# Patient Record
Sex: Male | Born: 1945 | Race: White | Hispanic: No | Marital: Married | Smoking: Never smoker
Health system: Southern US, Community
[De-identification: ages and names within clinical notes are randomized; demographics above are authoritative.]

## PROBLEM LIST (undated history)

## (undated) DIAGNOSIS — C801 Malignant (primary) neoplasm, unspecified: Secondary | ICD-10-CM

## (undated) DIAGNOSIS — K219 Gastro-esophageal reflux disease without esophagitis: Secondary | ICD-10-CM

## (undated) HISTORY — PX: GASTRIC RESECTION: SHX5248

---

## 2020-04-22 ENCOUNTER — Emergency Department
Admission: EM | Admit: 2020-04-22 | Discharge: 2020-04-22 | Disposition: A | Discharge status: Home / Self Care | Payer: Medicare Other | Attending: Emergency Medicine | Admitting: Emergency Medicine

## 2020-04-22 ENCOUNTER — Other Ambulatory Visit: Payer: Self-pay

## 2020-04-22 ENCOUNTER — Emergency Department: Payer: Medicare Other

## 2020-04-22 DIAGNOSIS — M545 Low back pain: Secondary | ICD-10-CM | POA: Insufficient documentation

## 2020-04-22 DIAGNOSIS — Z79899 Other long term (current) drug therapy: Secondary | ICD-10-CM | POA: Insufficient documentation

## 2020-04-22 DIAGNOSIS — Y999 Unspecified external cause status: Secondary | ICD-10-CM | POA: Insufficient documentation

## 2020-04-22 DIAGNOSIS — Y92410 Unspecified street and highway as the place of occurrence of the external cause: Secondary | ICD-10-CM | POA: Insufficient documentation

## 2020-04-22 DIAGNOSIS — Z7982 Long term (current) use of aspirin: Secondary | ICD-10-CM | POA: Insufficient documentation

## 2020-04-22 DIAGNOSIS — Y939 Activity, unspecified: Secondary | ICD-10-CM | POA: Insufficient documentation

## 2020-04-22 DIAGNOSIS — I6782 Cerebral ischemia: Secondary | ICD-10-CM | POA: Insufficient documentation

## 2020-04-22 MED ORDER — IBUPROFEN 400 MG PO TABS: 400.0000 mg | ORAL_TABLET | Freq: Four times a day (QID) | ORAL | 0 refills | Status: AC | PRN

## 2020-04-22 MED ORDER — LIDOCAINE 5 % EX PTCH: 1.0000 | MEDICATED_PATCH | CUTANEOUS | 0 refills | Status: AC
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
## 2020-09-07 DIAGNOSIS — Z95818 Presence of other cardiac implants and grafts: Secondary | ICD-10-CM

## 2020-09-07 HISTORY — DX: Presence of other cardiac implants and grafts: Z95.818
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
## 2022-12-16 ENCOUNTER — Encounter: Payer: Self-pay | Admitting: Internal Medicine

## 2022-12-22 ENCOUNTER — Encounter: Payer: Self-pay | Admitting: Internal Medicine

## 2022-12-23 ENCOUNTER — Ambulatory Visit
Admission: RE | Admit: 2022-12-23 | Discharge: 2022-12-23 | Disposition: A | Discharge status: Home / Self Care | Payer: Medicare Other | Attending: Internal Medicine | Admitting: Internal Medicine

## 2022-12-23 ENCOUNTER — Encounter
Admission: RE | Disposition: A | Discharge status: Home / Self Care | Payer: Self-pay | Source: Home / Self Care | Attending: Internal Medicine

## 2022-12-23 ENCOUNTER — Ambulatory Visit: Payer: Medicare Other | Admitting: Certified Registered"

## 2022-12-23 ENCOUNTER — Encounter: Payer: Self-pay | Admitting: Internal Medicine

## 2022-12-23 DIAGNOSIS — Z87891 Personal history of nicotine dependence: Secondary | ICD-10-CM | POA: Insufficient documentation

## 2022-12-23 DIAGNOSIS — Z85028 Personal history of other malignant neoplasm of stomach: Secondary | ICD-10-CM | POA: Diagnosis not present

## 2022-12-23 DIAGNOSIS — Z8673 Personal history of transient ischemic attack (TIA), and cerebral infarction without residual deficits: Secondary | ICD-10-CM | POA: Diagnosis not present

## 2022-12-23 DIAGNOSIS — I498 Other specified cardiac arrhythmias: Secondary | ICD-10-CM | POA: Diagnosis not present

## 2022-12-23 DIAGNOSIS — R0609 Other forms of dyspnea: Secondary | ICD-10-CM | POA: Diagnosis not present

## 2022-12-23 DIAGNOSIS — D122 Benign neoplasm of ascending colon: Secondary | ICD-10-CM | POA: Diagnosis not present

## 2022-12-23 DIAGNOSIS — K449 Diaphragmatic hernia without obstruction or gangrene: Secondary | ICD-10-CM | POA: Insufficient documentation

## 2022-12-23 DIAGNOSIS — B3781 Candidal esophagitis: Secondary | ICD-10-CM | POA: Diagnosis not present

## 2022-12-23 DIAGNOSIS — Z903 Acquired absence of stomach [part of]: Secondary | ICD-10-CM | POA: Insufficient documentation

## 2022-12-23 DIAGNOSIS — K2970 Gastritis, unspecified, without bleeding: Secondary | ICD-10-CM | POA: Insufficient documentation

## 2022-12-23 DIAGNOSIS — D123 Benign neoplasm of transverse colon: Secondary | ICD-10-CM | POA: Diagnosis not present

## 2022-12-23 DIAGNOSIS — Z1211 Encounter for screening for malignant neoplasm of colon: Secondary | ICD-10-CM | POA: Insufficient documentation

## 2022-12-23 DIAGNOSIS — K2100 Gastro-esophageal reflux disease with esophagitis, without bleeding: Secondary | ICD-10-CM | POA: Diagnosis not present

## 2022-12-23 DIAGNOSIS — Z86718 Personal history of other venous thrombosis and embolism: Secondary | ICD-10-CM | POA: Insufficient documentation

## 2022-12-23 DIAGNOSIS — K5730 Diverticulosis of large intestine without perforation or abscess without bleeding: Secondary | ICD-10-CM | POA: Insufficient documentation

## 2022-12-23 DIAGNOSIS — Z86010 Personal history of colonic polyps: Secondary | ICD-10-CM | POA: Diagnosis not present

## 2022-12-23 DIAGNOSIS — Z9221 Personal history of antineoplastic chemotherapy: Secondary | ICD-10-CM | POA: Diagnosis not present

## 2022-12-23 DIAGNOSIS — K640 First degree hemorrhoids: Secondary | ICD-10-CM | POA: Diagnosis not present

## 2022-12-23 DIAGNOSIS — D124 Benign neoplasm of descending colon: Secondary | ICD-10-CM | POA: Insufficient documentation

## 2022-12-23 DIAGNOSIS — D120 Benign neoplasm of cecum: Secondary | ICD-10-CM | POA: Diagnosis not present

## 2022-12-23 HISTORY — PX: ESOPHAGOGASTRODUODENOSCOPY (EGD) WITH PROPOFOL: SHX5813

## 2022-12-23 HISTORY — DX: Gastro-esophageal reflux disease without esophagitis: K21.9

## 2022-12-23 HISTORY — PX: COLONOSCOPY WITH PROPOFOL: SHX5780

## 2022-12-23 HISTORY — DX: Malignant (primary) neoplasm, unspecified: C80.1

## 2022-12-23 SURGERY — COLONOSCOPY WITH PROPOFOL
Anesthesia: General

## 2022-12-23 MED ORDER — LIDOCAINE HCL (CARDIAC) PF 100 MG/5ML IV SOSY
PREFILLED_SYRINGE | INTRAVENOUS | Status: DC | PRN
  Administered 2022-12-23: 100 mg via INTRAVENOUS

## 2022-12-23 MED ORDER — SPOT INK MARKER SYRINGE KIT
PACK | SUBMUCOSAL | Status: DC | PRN
  Administered 2022-12-23: 1 mL via SUBMUCOSAL

## 2022-12-23 MED ORDER — PROPOFOL 500 MG/50ML IV EMUL
INTRAVENOUS | Status: DC | PRN
  Administered 2022-12-23: 50 mg via INTRAVENOUS
  Administered 2022-12-23: 200 ug/kg/min via INTRAVENOUS

## 2022-12-23 MED ORDER — SODIUM CHLORIDE 0.9 % IV SOLN
INTRAVENOUS | Status: AC | PRN
  Administered 2022-12-23: .5 mL via INTRAMUSCULAR

## 2022-12-23 MED ORDER — SODIUM CHLORIDE 0.9 % IV SOLN: INTRAVENOUS | Status: DC

## 2022-12-23 NOTE — Anesthesia Postprocedure Evaluation (Signed)
Anesthesia Post Note  Patient: Jeffrey Robles  Procedure(s) Performed: COLONOSCOPY WITH PROPOFOL ESOPHAGOGASTRODUODENOSCOPY (EGD) WITH PROPOFOL  Patient location during evaluation: PACU Anesthesia Type: General Level of consciousness: oriented and awake and alert Pain management: pain level controlled Vital Signs Assessment: post-procedure vital signs reviewed and stable Respiratory status: spontaneous breathing and nonlabored ventilation Cardiovascular status: blood pressure returned to baseline Anesthetic complications: no   There were no known notable events for this encounter.   Last Vitals:  Vitals:   12/23/22 0900 12/23/22 1027  BP: (!) 138/93 123/84  Pulse: 63 70  Resp: 17 15  Temp: 36.8 C (!) 35.8 C  SpO2: 97% 95%    Last Pain:  Vitals:   12/23/22 1027  TempSrc: Temporal  PainSc: Asleep                 VAN STAVEREN,Virdell Hoiland

## 2022-12-23 NOTE — Anesthesia Preprocedure Evaluation (Signed)
Anesthesia Evaluation  Patient identified by MRN, date of birth, ID band Patient awake    Reviewed: Allergy & Precautions, NPO status , Patient's Chart, lab work & pertinent test results  Airway Mallampati: II  TM Distance: >3 FB Neck ROM: Full    Dental  (+) Upper Dentures, Lower Dentures   Pulmonary neg pulmonary ROS, Patient abstained from smoking., former smoker   Pulmonary exam normal  + decreased breath sounds      Cardiovascular Exercise Tolerance: Poor + DOE  negative cardio ROS Normal cardiovascular exam Rhythm:Regular Rate:Normal  Brugada Syndrome   Neuro/Psych negative neurological ROS  negative psych ROS   GI/Hepatic negative GI ROS, Neg liver ROS,GERD  Medicated,,Hx of stomach cancer   Endo/Other  negative endocrine ROS    Renal/GU negative Renal ROS  negative genitourinary   Musculoskeletal   Abdominal Normal abdominal exam  (+)   Peds negative pediatric ROS (+)  Hematology negative hematology ROS (+)   Anesthesia Other Findings Past Medical History: No date: Cancer No date: GERD (gastroesophageal reflux disease) 2022: Presence of implantable pulmonary artery pressure and heart  rate monitoring system  Past Surgical History: No date: GASTRIC RESECTION  BMI    Body Mass Index: 26.72 kg/m      Reproductive/Obstetrics negative OB ROS                             Anesthesia Physical Anesthesia Plan  ASA: 3  Anesthesia Plan: General   Post-op Pain Management:    Induction: Intravenous  PONV Risk Score and Plan: Propofol infusion and TIVA  Airway Management Planned: Natural Airway  Additional Equipment:   Intra-op Plan:   Post-operative Plan:   Informed Consent: I have reviewed the patients History and Physical, chart, labs and discussed the procedure including the risks, benefits and alternatives for the proposed anesthesia with the patient or authorized  representative who has indicated his/her understanding and acceptance.     Dental Advisory Given  Plan Discussed with: CRNA and Surgeon  Anesthesia Plan Comments:        Anesthesia Quick Evaluation

## 2022-12-23 NOTE — Op Note (Signed)
[facility] Gastroenterology Patient Name: Jeffrey Robles Procedure Date: 12/23/2022 9:29 AM MRN: [id number] Account #: [id number] Date of Birth: 20-Apr-1946 Admit Type: Outpatient Age: 77 Room: [facility] ENDO ROOM 2 Gender: Male Note Status: Finalized Instrument Name: Upper Endoscope [id number] Procedure:             Upper GI endoscopy Indications:           Gastro-esophageal reflux disease, Failure to respond                         to medical treatment Providers:             Boykin Nearing. Timira Bieda MD, MD Referring MD:          No PCP Medicines:             Propofol per Anesthesia Complications:         No immediate complications. Estimated blood loss: None. Procedure:             Pre-Anesthesia Assessment:                        - The risks and benefits of the procedure and the                         sedation options and risks were discussed with the                         patient. All questions were answered and informed                         consent was obtained.                        - Patient identification and proposed procedure were                         verified prior to the procedure by the nurse. The                         procedure was verified in the procedure room.                        - ASA Grade Assessment: III - A patient with severe                         systemic disease.                        - After reviewing the risks and benefits, the patient                         was deemed in satisfactory condition to undergo the                         procedure.                        After obtaining informed consent, the endoscope was  passed under direct vision. Throughout the procedure,                         the patient's blood pressure, pulse, and oxygen                         saturations were monitored continuously. The Endoscope                         was introduced through the mouth, and advanced to the                          third part of duodenum. The upper GI endoscopy was                         accomplished without difficulty. The patient tolerated                         the procedure well. Findings:      Mucosal changes including feline appearance, longitudinal furrows and       white plaques were found in the entire esophagus. Esophageal findings       were graded using the Eosinophilic Esophagitis Endoscopic Reference       Score (EoE-EREFS) as: Edema Grade 1 Present (decreased clarity or       absence of vascular markings), Rings Grade 1 Mild (subtle       circumferential ridges seen on esophageal distension), Exudates Grade 2       Severe (scattered white lesions involving 10 percent or greater of the       esophageal surface area), Furrows Grade 1 Mild (vertical lines without       visible depth) and Stricture none (no stricture found). Biopsies were       obtained from the proximal and distal esophagus with cold forceps for       histology of suspected eosinophilic esophagitis.      Scattered mild inflammation characterized by congestion (edema) and       erythema was found in the entire examined stomach.      A 2 cm hiatal hernia was present.      The examined duodenum was normal.      The exam was otherwise without abnormality. 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                        - Gastritis.                        - 2 cm hiatal hernia.                        - Normal examined duodenum.                        - The examination was otherwise normal.                        - Biopsies were taken with  a cold forceps for                         evaluation of eosinophilic esophagitis. Recommendation:        - Continue present medications.                        - Await pathology results.                        - Proceed with colonoscopy Procedure Code(s):     --- Professional ---                         [phone], Esophagogastroduodenoscopy, flexible,                         transoral; with biopsy, single or multiple Diagnosis Code(s):     --- Professional ---                        K21.9, Gastro-esophageal reflux disease without                         esophagitis                        K44.9, Diaphragmatic hernia without obstruction or                         gangrene                        K29.70, Gastritis, unspecified, without bleeding                        K22.89, Other specified disease of esophagus CPT copyright 2022 American Medical Association. All rights reserved. The codes documented in this report are preliminary and upon coder review may  be revised to meet current compliance requirements. Stanton Kidney MD, MD 12/23/2022 9:54:59 AM This report has been signed electronically. Number of Addenda: 0 Note Initiated On: 12/23/2022 9:29 AM Estimated Blood Loss:  Estimated blood loss: none.      [facility]

## 2022-12-23 NOTE — H&P (Signed)
Outpatient short stay form Pre-procedure 12/23/2022 7:53 AM Jeffrey Robles K. Norma Fredrickson, M.D.  Primary Physician: Dr. Georjean Mode [facility])  Reason for visit:  GERD, esophagitis, personal history of colon polyps  History of present illness:  Patient request screening colonoscopy.Patient denies abdominal pain, change in bowel habits, rectal bleeding or involuntary weight loss. Patient underwent an attempted colonoscopy evaluation in August 2006 by Dr. Zettie Cooley and it [facility]. Incomplete evaluation to the transverse colon was documented with the only findings of sigmoid diverticulosis noted. Patient has a remote history of adenomatous colon polyps. Follow-up CT colonography in August 2006 revealed under distention of the left colon with incomplete evaluation. There was a small "pedunculated nodule" noted in the ascending colon questionable for fecal matter versus tiny polyp. The patient presents today also mentioning colonoscopy in [place] (circa 2016) revealing no polyps, colonoscopy January 2023 in [place] revealing "9 polyps". Patient also underwent an EGD in January 2023 revealing "polyps in the esophagus". Patient is a gastric cancer survivor and underwent partial gastrectomy with postop chemotherapy in 1994. From an upper GI standpoint, patient has a history of GERD which can be "terrible" without medication. He takes to 20 mg esomeprazole tablets in the morning and has fewer than 2 breakthrough events of reflux a month. Patient denies any known history of Barrett's esophagus, dysphagia, involuntary weight loss. He reports a good appetite. He denies hematemesis or melena. 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 He moved several times over this time period. 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 He denies taking any current anticoagulants. Other than mild tremor, no other potential sequelae of  stroke are noted.    No current facility-administered medications for this encounter.  Medications Prior to Admission  Medication Sig Dispense Refill Last Dose   omeprazole (PRILOSEC OTC) 20 MG tablet Take 40 mg by mouth daily.      aspirin 325 MG tablet Take 325 mg by mouth daily.      atorvastatin (LIPITOR) 20 MG tablet Take 20 mg by mouth daily.      DULoxetine (CYMBALTA) 60 MG capsule Take 60 mg by mouth daily.      ibuprofen (ADVIL) 400 MG tablet Take 1 tablet (400 mg total) by mouth every 6 (six) hours as needed. 30 tablet 0    lidocaine (LIDODERM) 5 % Place 1 patch onto the skin daily. Remove & Discard patch within 12 hours or as directed by MD 30 patch 0    metoprolol tartrate (LOPRESSOR) 50 MG tablet Take 50 mg by mouth 2 (two) times daily.        Not on File   Past Medical History:  Diagnosis Date   Cancer    GERD (gastroesophageal reflux disease)    Presence of implantable pulmonary artery pressure and heart rate monitoring system 2022    Review of systems:  Otherwise negative.    Physical Exam  Gen: Alert, oriented. Appears stated age.  HEENT: /AT. PERRLA. Lungs: CTA, no wheezes. CV: RR nl S1, S2. Abd: soft, benign, no masses. BS+ Ext: No edema. Pulses 2+    Planned procedures: Proceed with EGD and  colonoscopy. The patient understands the nature of the planned procedure, indications, risks, alternatives and potential complications including but not limited to bleeding, infection, perforation, damage to internal organs and possible oversedation/side effects from anesthesia. The patient agrees and gives consent to proceed.  Please refer to  procedure notes for findings, recommendations and patient disposition/instructions.     Laurent Cargile K. Norma Fredrickson, M.D. Gastroenterology 12/23/2022  7:53 AM

## 2022-12-23 NOTE — Transfer of Care (Signed)
Immediate Anesthesia Transfer of Care Note  Patient: Jeffrey Robles  Procedure(s) Performed: COLONOSCOPY WITH PROPOFOL ESOPHAGOGASTRODUODENOSCOPY (EGD) WITH PROPOFOL  Patient Location: PACU  Anesthesia Type:General  Level of Consciousness: drowsy  Airway & Oxygen Therapy: Patient Spontanous Breathing and Patient connected to nasal cannula oxygen  Post-op Assessment: Report given to RN and Post -op Vital signs reviewed and stable  Post vital signs: stable  Last Vitals:  Vitals Value Taken Time  BP 123/84 12/23/22 1027  Temp 35.8 C 12/23/22 1027  Pulse 70 12/23/22 1027  Resp 15 12/23/22 1027  SpO2 95 % 12/23/22 1027    Last Pain:  Vitals:   12/23/22 1027  TempSrc: Temporal  PainSc: Asleep         Complications: No notable events documented.

## 2022-12-23 NOTE — Op Note (Signed)
[facility] Gastroenterology Patient Name: Jeffrey Robles Procedure Date: 12/23/2022 9:28 AM MRN: [id number] Account #: [id number] Date of Birth: [birth year] Admit Type: Outpatient Age: 77 Room: [facility] [address] ENDO ROOM 2 Gender: Male Note Status: Finalized Instrument Name: Prentice Docker [id number] Procedure:             Colonoscopy Indications:           High risk colon cancer surveillance: Personal history                         of multiple (3 or more) adenomas Providers:             Boykin Nearing. Trinitie Mcgirr MD, MD Referring MD:          no PCP Medicines:             Propofol per Anesthesia Complications:         No immediate complications. Estimated blood loss: None. Procedure:             Pre-Anesthesia Assessment:                        - The risks and benefits of the procedure and the                         sedation options and risks were discussed with the                         patient. All questions were answered and informed                         consent was obtained.                        - Patient identification and proposed procedure were                         verified prior to the procedure by the nurse. The                         procedure was verified in the procedure room.                        - ASA Grade Assessment: III - A patient with severe                         systemic disease.                        - After reviewing the risks and benefits, the patient                         was deemed in satisfactory condition to undergo the                         procedure.                        After obtaining informed consent, the colonoscope was  passed under direct vision. Throughout the procedure,                         the patient's blood pressure, pulse, and oxygen                         saturations were monitored continuously. The                         Colonoscope was introduced through the anus and                          advanced to the the cecum, identified by appendiceal                         orifice and ileocecal valve. The colonoscopy was                         performed without difficulty. The patient tolerated                         the procedure well. The quality of the bowel                         preparation was adequate. The ileocecal valve,                         appendiceal orifice, and rectum were photographed. Findings:      The perianal and digital rectal examinations were normal. Pertinent       negatives include normal sphincter tone and no palpable rectal lesions.      Non-bleeding internal hemorrhoids were found during retroflexion. The       hemorrhoids were Grade I (internal hemorrhoids that do not prolapse).      Many medium-mouthed and small-mouthed diverticula were found in the       transverse colon and left colon.      A 4 mm polyp was found in the ileocecal valve. The polyp was sessile.       The polyp was removed with a jumbo cold forceps. Resection and retrieval       were complete.      Two sessile polyps were found in the transverse colon. The polyps were 3       to 5 mm in size. These polyps were removed with a jumbo cold forceps.       Resection and retrieval were complete.      A 4 mm polyp was found in the ascending colon. The polyp was sessile.       The polyp was removed with a jumbo cold forceps. Resection and retrieval       were complete.      A 20 mm polyp was found in the descending colon. The polyp was       carpet-like. Polypectomy was attempted, initially using a saline       injection-lift technique with a cold snare. Polyp resection was       incomplete with this device. This intervention then required a different       device and polypectomy technique. The polyp was removed with a piecemeal       technique using a hot snare. Resection  and retrieval were complete. To       prevent bleeding after the polypectomy, two hemostatic clips were        successfully placed (MR conditional). Clip manufacturer: [employer]. There was no bleeding at the end of the procedure. Area was       successfully injected with 1 mL Spot (carbon black) for tattooing.      The exam was otherwise without abnormality. Impression:            - Non-bleeding internal hemorrhoids.                        - Diverticulosis in the transverse colon and in the                         left colon.                        - One 4 mm polyp at the ileocecal valve, removed with                         a jumbo cold forceps. Resected and retrieved.                        - Two 3 to 5 mm polyps in the transverse colon,                         removed with a jumbo cold forceps. Resected and                         retrieved.                        - One 4 mm polyp in the ascending colon, removed with                         a jumbo cold forceps. Resected and retrieved.                        - One 20 mm polyp in the descending colon, removed                         piecemeal using a hot snare. Resected and retrieved.                         Clips (MR conditional) were placed. Clip manufacturer:                         [employer]. Injected.                        - The examination was otherwise normal. Recommendation:        - Patient has a contact number available for                         emergencies. The signs and symptoms of potential                         delayed complications  were discussed with the patient.                         Return to normal activities tomorrow. Written                         discharge instructions were provided to the patient.                        - Await pathology results from EGD, also performed                         today.                        - Resume previous diet.                        - Continue present medications.                        - Repeat colonoscopy is recommended for surveillance.                          The colonoscopy date will be determined after                         pathology results from today's exam become available                         for review.                        - Return to my office in 3 months.                        - Telephone GI office to schedule appointment.                        - The findings and recommendations were discussed with                         the patient. Procedure Code(s):     --- Professional ---                        [phone], Colonoscopy, flexible; with removal of                         tumor(s), polyp(s), or other lesion(s) by snare                         technique                        45380, 59, Colonoscopy, flexible; with biopsy, single                         or multiple                        45381, Colonoscopy, flexible; with directed submucosal  injection(s), any substance Diagnosis Code(s):     --- Professional ---                        K57.30, Diverticulosis of large intestine without                         perforation or abscess without bleeding                        D12.3, Benign neoplasm of transverse colon (hepatic                         flexure or splenic flexure)                        D12.4, Benign neoplasm of descending colon                        D12.0, Benign neoplasm of cecum                        D12.2, Benign neoplasm of ascending colon                        K64.0, First degree hemorrhoids                        Z86.010, Personal history of colonic polyps CPT copyright 2022 American Medical Association. All rights reserved. The codes documented in this report are preliminary and upon coder review may  be revised to meet current compliance requirements. Stanton Kidney MD, MD 12/23/2022 10:33:51 AM This report has been signed electronically. Number of Addenda: 0 Note Initiated On: 12/23/2022 9:28 AM Scope Withdrawal Time: 0 hours 22 minutes 27 seconds  Total Procedure Duration:  0 hours 29 minutes 37 seconds  Estimated Blood Loss:  Estimated blood loss: none. Estimated blood loss:                         none. Estimated blood loss: none.      [facility]

## 2022-12-24 ENCOUNTER — Encounter: Payer: Self-pay | Admitting: Internal Medicine

## 2022-12-24 LAB — SURGICAL PATHOLOGY

## 2023-01-13 ENCOUNTER — Encounter: Payer: Self-pay | Admitting: Internal Medicine

## 2023-08-19 ENCOUNTER — Other Ambulatory Visit: Payer: Self-pay | Admitting: Cardiovascular Disease

## 2023-08-19 DIAGNOSIS — Z0000 Encounter for general adult medical examination without abnormal findings: Secondary | ICD-10-CM

## 2023-09-06 ENCOUNTER — Ambulatory Visit: Payer: PRIVATE HEALTH INSURANCE

## 2023-09-06 DIAGNOSIS — Z0000 Encounter for general adult medical examination without abnormal findings: Secondary | ICD-10-CM

## 2023-12-25 ENCOUNTER — Other Ambulatory Visit: Payer: Self-pay

## 2023-12-25 ENCOUNTER — Emergency Department
Admission: EM | Admit: 2023-12-25 | Discharge: 2023-12-28 | Disposition: A | Discharge status: Home / Self Care | Payer: No Typology Code available for payment source | Attending: Emergency Medicine | Admitting: Emergency Medicine

## 2023-12-25 ENCOUNTER — Emergency Department: Payer: No Typology Code available for payment source

## 2023-12-25 DIAGNOSIS — R451 Restlessness and agitation: Secondary | ICD-10-CM | POA: Insufficient documentation

## 2023-12-25 DIAGNOSIS — F03911 Unspecified dementia, unspecified severity, with agitation: Secondary | ICD-10-CM | POA: Diagnosis present

## 2023-12-25 DIAGNOSIS — Z7189 Other specified counseling: Secondary | ICD-10-CM | POA: Diagnosis not present

## 2023-12-25 DIAGNOSIS — I10 Essential (primary) hypertension: Secondary | ICD-10-CM | POA: Diagnosis not present

## 2023-12-25 DIAGNOSIS — Z8673 Personal history of transient ischemic attack (TIA), and cerebral infarction without residual deficits: Secondary | ICD-10-CM | POA: Insufficient documentation

## 2023-12-25 DIAGNOSIS — F329 Major depressive disorder, single episode, unspecified: Secondary | ICD-10-CM | POA: Insufficient documentation

## 2023-12-25 DIAGNOSIS — R1311 Dysphagia, oral phase: Secondary | ICD-10-CM | POA: Diagnosis not present

## 2023-12-25 DIAGNOSIS — F03918 Unspecified dementia, unspecified severity, with other behavioral disturbance: Secondary | ICD-10-CM | POA: Diagnosis not present

## 2023-12-25 DIAGNOSIS — F0390 Unspecified dementia without behavioral disturbance: Secondary | ICD-10-CM | POA: Diagnosis not present

## 2023-12-25 DIAGNOSIS — F4310 Post-traumatic stress disorder, unspecified: Secondary | ICD-10-CM | POA: Diagnosis not present

## 2023-12-25 DIAGNOSIS — Z515 Encounter for palliative care: Secondary | ICD-10-CM | POA: Insufficient documentation

## 2023-12-25 DIAGNOSIS — F411 Generalized anxiety disorder: Secondary | ICD-10-CM | POA: Insufficient documentation

## 2023-12-25 DIAGNOSIS — Z66 Do not resuscitate: Secondary | ICD-10-CM

## 2023-12-25 LAB — CBC WITH DIFFERENTIAL/PLATELET
Abs Immature Granulocytes: 0.01 10*3/uL (ref 0.00–0.07)
Basophils Absolute: 0.1 10*3/uL (ref 0.0–0.1)
Basophils Relative: 2 %
Eosinophils Absolute: 0.1 10*3/uL (ref 0.0–0.5)
Eosinophils Relative: 3 %
HCT: 29.5 % — ABNORMAL LOW (ref 39.0–52.0)
Hemoglobin: 10.1 g/dL — ABNORMAL LOW (ref 13.0–17.0)
Immature Granulocytes: 0 %
Lymphocytes Relative: 22 %
Lymphs Abs: 1.2 10*3/uL (ref 0.7–4.0)
MCH: 35.3 pg — ABNORMAL HIGH (ref 26.0–34.0)
MCHC: 34.2 g/dL (ref 30.0–36.0)
MCV: 103.1 fL — ABNORMAL HIGH (ref 80.0–100.0)
Monocytes Absolute: 0.7 10*3/uL (ref 0.1–1.0)
Monocytes Relative: 13 %
Neutro Abs: 3.2 10*3/uL (ref 1.7–7.7)
Neutrophils Relative %: 60 %
Platelets: 257 10*3/uL (ref 150–400)
RBC: 2.86 MIL/uL — ABNORMAL LOW (ref 4.22–5.81)
RDW: 14.4 % (ref 11.5–15.5)
WBC: 5.2 10*3/uL (ref 4.0–10.5)
nRBC: 0 % (ref 0.0–0.2)

## 2023-12-25 LAB — URINALYSIS, ROUTINE W REFLEX MICROSCOPIC
Bilirubin Urine: NEGATIVE
Glucose, UA: NEGATIVE mg/dL
Hgb urine dipstick: NEGATIVE
Ketones, ur: NEGATIVE mg/dL
Leukocytes,Ua: NEGATIVE
Nitrite: NEGATIVE
Protein, ur: NEGATIVE mg/dL
Specific Gravity, Urine: 1.004 — ABNORMAL LOW (ref 1.005–1.030)
pH: 6 (ref 5.0–8.0)

## 2023-12-25 LAB — COMPREHENSIVE METABOLIC PANEL WITH GFR
ALT: 25 U/L (ref 0–44)
AST: 74 U/L — ABNORMAL HIGH (ref 15–41)
Albumin: 1.9 g/dL — ABNORMAL LOW (ref 3.5–5.0)
Alkaline Phosphatase: 63 U/L (ref 38–126)
Anion gap: 8 (ref 5–15)
BUN: 12 mg/dL (ref 8–23)
CO2: 21 mmol/L — ABNORMAL LOW (ref 22–32)
Calcium: 8.3 mg/dL — ABNORMAL LOW (ref 8.9–10.3)
Chloride: 102 mmol/L (ref 98–111)
Creatinine, Ser: 0.94 mg/dL (ref 0.61–1.24)
GFR, Estimated: >60 mL/min (ref >60)
Glucose, Bld: 78 mg/dL (ref 70–99)
Potassium: 4.5 mmol/L (ref 3.5–5.1)
Sodium: 131 mmol/L — ABNORMAL LOW (ref 135–145)
Total Bilirubin: 2.6 mg/dL — ABNORMAL HIGH (ref 0.0–1.2)
Total Protein: 5.9 g/dL — ABNORMAL LOW (ref 6.5–8.1)

## 2023-12-25 LAB — AMMONIA: Ammonia: 65 umol/L — ABNORMAL HIGH (ref 9–35)

## 2023-12-25 MED ORDER — MAGNESIUM OXIDE 400 MG PO TABS
400.0000 mg | ORAL_TABLET | Freq: Two times a day (BID) | ORAL | Status: DC
  Administered 2023-12-25 – 2023-12-27 (×3): 400 mg via ORAL
  Filled 2023-12-25 (×12): qty 1

## 2023-12-25 MED ORDER — FOLIC ACID 1 MG PO TABS
1.0000 mg | ORAL_TABLET | Freq: Every day | ORAL | Status: DC
  Administered 2023-12-25 – 2023-12-27 (×3): 1 mg via ORAL
  Filled 2023-12-25 (×4): qty 1

## 2023-12-25 MED ORDER — OLANZAPINE 5 MG PO TABS
15.0000 mg | ORAL_TABLET | Freq: Every day | ORAL | Status: DC
  Administered 2023-12-25: 15 mg via ORAL
  Filled 2023-12-25 (×2): qty 1

## 2023-12-25 MED ORDER — LACTULOSE 10 GM/15ML PO SOLN
10.0000 g | Freq: Every day | ORAL | Status: DC
  Administered 2023-12-25: 10 g via ORAL
  Filled 2023-12-25 (×3): qty 30

## 2023-12-25 MED ORDER — PANTOPRAZOLE SODIUM 40 MG PO TBEC
40.0000 mg | DELAYED_RELEASE_TABLET | Freq: Every day | ORAL | Status: DC
  Administered 2023-12-25 – 2023-12-27 (×3): 40 mg via ORAL
  Filled 2023-12-25 (×4): qty 1

## 2023-12-25 MED ORDER — LORAZEPAM 0.5 MG PO TABS
0.5000 mg | ORAL_TABLET | Freq: Every day | ORAL | Status: DC | PRN
  Administered 2023-12-25 – 2023-12-27 (×3): 0.5 mg via ORAL
  Filled 2023-12-25 (×3): qty 1

## 2023-12-25 MED ORDER — MELATONIN 5 MG PO TABS
2.5000 mg | ORAL_TABLET | Freq: Every day | ORAL | Status: DC
  Administered 2023-12-25: 2.5 mg via ORAL
  Filled 2023-12-25 (×2): qty 1

## 2023-12-25 MED ORDER — ESCITALOPRAM OXALATE 10 MG PO TABS
10.0000 mg | ORAL_TABLET | Freq: Every day | ORAL | Status: DC
  Administered 2023-12-25 – 2023-12-27 (×3): 10 mg via ORAL
  Filled 2023-12-25 (×4): qty 1

## 2023-12-25 MED ORDER — MIRTAZAPINE 15 MG PO TABS
7.5000 mg | ORAL_TABLET | Freq: Every day | ORAL | Status: DC
  Administered 2023-12-25: 7.5 mg via ORAL
  Filled 2023-12-25 (×2): qty 1

## 2023-12-25 MED ORDER — VITAMIN D (ERGOCALCIFEROL) 1.25 MG (50000 UNIT) PO CAPS: 50000.0000 [IU] | ORAL_CAPSULE | ORAL | Status: DC

## 2023-12-25 MED ORDER — LORAZEPAM 2 MG/ML IJ SOLN
1.0000 mg | Freq: Once | INTRAMUSCULAR | Status: AC
  Administered 2023-12-25: 1 mg via INTRAVENOUS
  Filled 2023-12-25: qty 1

## 2023-12-25 MED ORDER — DROPERIDOL 2.5 MG/ML IJ SOLN: 2.5000 mg | Freq: Once | INTRAMUSCULAR | Status: DC

## 2023-12-25 MED ORDER — MIDODRINE HCL 5 MG PO TABS
5.0000 mg | ORAL_TABLET | Freq: Three times a day (TID) | ORAL | Status: DC
  Administered 2023-12-25 – 2023-12-28 (×6): 5 mg via ORAL
  Filled 2023-12-25 (×7): qty 1

## 2023-12-25 NOTE — TOC Initial Note (Signed)
 Transition of Care [facility]) - Initial/Assessment Note    Patient Details  Name: Jeffrey Robles MRN: [id number] Date of Birth: April 04, 1946  [facility] CM/SW Contact:    Kihanna Kamiya E Satvik Parco, LCSW Phone Number: 12/25/2023, 2:46 PM  Clinical Narrative:                 [facility] consult received: "Patient has dementia and family is unable to care for him at home.  They are asking for assistance with placement.  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 Devra Fontana reports patient lives with her in [place], however they have been staying in their son's home in [place] for additional support. Son has 3 small children. Patient goes to the [facility] for primary care. Patient has a RW, cane, and hospital bed. Patient is active with home health services. Devra Fontana states they cannot meet patient's needs at home and need placement. She states they do not feel safe with patient returning to her son's home and that she can't care for him at their home independently due to his Dementia. Discussed payment for LTC placement including Medicaid, private pay, or long term care insurance. She states they cannot private pay. She states that patient has Tricare and Medicare. She would like to know what the VA would cover as far as placement. CSW will attempt to reach out to the [facility] to follow up. Devra Fontana also requests to talk to someone about hospice - updated MD and Palliative NPs  Expected Discharge Plan: Memory Care Barriers to Discharge: Continued Medical Work up   Patient Goals and CMS Choice   CMS Medicare.gov Compare Post Acute Care list provided to:: Patient Represenative (must comment) Choice offered to / list presented to : Spouse      Expected Discharge Plan and Services       Living arrangements  for the past 2 months: Single Family Home                                      Prior Living Arrangements/Services Living arrangements for the past 2 months: Single Family Home Lives with:: Spouse Patient language and need for interpreter reviewed:: Yes Do you feel safe going back to the place where you live?: No   spouse does not feel safe with patient returning home  Need for Family Participation in Patient Care: Yes (Comment) Care giver support system in place?: Yes (comment) Current home services: DME, Home PT Criminal Activity/Legal Involvement Pertinent to Current Situation/Hospitalization: No - Comment as needed  Activities of Daily Living      Permission Sought/Granted Permission sought to share information with : Oceanographer granted to share information with : Yes, Verbal Permission Granted     Permission granted to share info w AGENCY: SNF, ALF, VA, Hospice, etc as needed        Emotional Assessment       Orientation: : Fluctuating Orientation (Suspected and/or reported Sundowners) Alcohol / Substance Use: Not Applicable Psych Involvement: No (comment)  Admission diagnosis:  Fall; back pain Patient Active Problem List   Diagnosis Date Noted   Dementia with behavioral disturbance (HCC) 12/25/2023   PCP:  Patient, No Pcp Per Pharmacy:   [id number] [place], [place] - [address] AT [facility] OF [address] [place] [address] Phone: [phone] Fax: [phone]     Social Drivers of Health (SDOH) Social History: SDOH Screenings   Food Insecurity: No Food Insecurity (12/14/2023)   Received from [facility]  Housing: Low Risk  (10/01/2023)   Received from [facility]  Transportation Needs: No Transportation Needs (12/14/2023)   Received from [facility]  Utilities: Not At Risk (10/01/2023)   Received from [facility]  Financial Resource Strain: Low Risk  (12/14/2023)   Received from [facility]  Physical Activity: Inactive (04/15/2023)   Received from [facility]  Social Connections: Moderately Integrated (04/15/2023)   Received from [facility]  Stress: Stress Concern Present (04/15/2023)   Received from [facility]  Tobacco Use: Low Risk  (12/25/2023)  Recent Concern: Tobacco Use - Medium Risk (12/05/2023)   Received from [facility]  Health Literacy: Adequate Health Literacy (04/15/2023)   Received from [facility]   SDOH Interventions:     Readmission Risk Interventions     No data to display

## 2023-12-25 NOTE — ED Notes (Signed)
 Pt is resting in bed on his left side. Respirations are even and unlabored. No distress observed.

## 2023-12-25 NOTE — ED Notes (Signed)
 This RN to bedside with Tech due to fall alarm going off. Pt was sitting on the edge of the bed and saturated in urine. This RN and tech cleaned pt and placed new pure wick.

## 2023-12-25 NOTE — Consult Note (Signed)
 [facility] Psychiatric Consult Initial  Patient Name: .Jeffrey Robles  MRN: [id number]  DOB: July 01, 1946  Consult Order details:  Orders (From admission, onward)     Start     Ordered   12/25/23 1145  IP CONSULT TO PSYCHIATRY       Ordering Provider: Twilla Galea, MD  Provider:  (Not yet assigned)  Question Answer Comment  Place call to: [phone]   Reason for Consult Admit      12/25/23 1144   12/25/23 1145  CONSULT TO CALL ACT TEAM       Ordering Provider: Twilla Galea, MD  Provider:  (Not yet assigned)  Question:  Reason for Consult?  Answer:  Agitation   12/25/23 1144             Mode of Visit: In person    Psychiatry Consult Evaluation  Service Date: December 25, 2023 LOS:  LOS: 0 days  Chief Complaint Per family, aggression at night  Primary Psychiatric Diagnoses  Dementia  Assessment  Jeffrey Robles is a 78 y.o. male admitted: Presented to the [facility] 12/25/2023  8:55 AM brought in from home by EMS initially for back pain and subsequently family requested help with patient agitation. He carries the psychiatric diagnoses of dementia, PTSD, MDD, alcohol abuse and GAD and has a past medical history of DVT, CVA, HTN, malignant neoplasm of stomach, GERD, osteoporosis, hearing loss, degenerative joint disease, AKI, psoriasis and left total knee replacement.   His current presentation of impaired memory, altered mental status and sundowning agitation is most consistent with his current dementia diagnosis. He meets criteria for skilled nursing facility placement based on patient's current condition and level of care needed; family is also interested in a hospice consult.  Current outpatient psychotropic medications include lexapro , ativan , remeron  and zyprexa  and historically he has had an average response to these medications. He was compliant with medications prior to admission as evidenced by family report. On initial examination, patient is calm, but unable to answer questions.  Please see plan below for detailed recommendations.   Diagnoses:  Active Hospital problems: Principal Problem:   Dementia with behavioral disturbance (HCC)    Plan   ## Psychiatric Medication Recommendations:  Continue home medications --lexapro  10mg  PO Q day --remeron  7.5mg  PO Q HS --zyprexa  15mg  PO Q HS Change how to take ativan  --ativan  0.5mg  PO Q HS   ## Medical Decision Making Capacity: Not specifically addressed in this encounter  ## Further Work-up:  -- CT head without contrast  -- most recent EKG on 12/25/2023 had QtC of 511 -- Pertinent labwork reviewed earlier this admission includes: CBC, CMP, UA and ammonia   ## Disposition:-- There are no psychiatric contraindications to discharge at this time  ## Behavioral / Environmental: -Delirium Precautions: Delirium Interventions for Nursing and Staff: - RN to open blinds every AM. - To Bedside: Glasses, hearing aide, and pt's own shoes. Make available to patients. when possible and encourage use. - Encourage po fluids when appropriate, keep fluids within reach. - OOB to chair with meals. - Passive ROM exercises to all extremities with AM & PM care. - RN to assess orientation to person, time and place QAM and PRN. - Recommend extended visitation hours with familiar family/friends as feasible. - Staff to minimize disturbances at night. Turn off television when pt asleep or when not in use.    ## Safety and Observation Level:  - Based on my clinical evaluation, I estimate the patient to be at low risk  of self harm in the current setting. - At this time, we recommend  routine. This decision is based on my review of the chart including patient's history and current presentation, interview of the patient, mental status examination, and consideration of suicide risk including evaluating suicidal ideation, plan, intent, suicidal or self-harm behaviors, risk factors, and protective factors. This judgment is based on our ability to directly  address suicide risk, implement suicide prevention strategies, and develop a safety plan while the patient is in the clinical setting. Please contact our team if there is a concern that risk level has changed.  CSSR Risk Category:C-SSRS RISK CATEGORY: No Risk  Suicide Risk Assessment: Patient has following modifiable risk factors for suicide: none, which we are addressing by recommending nursing home placement. Patient has following non-modifiable or demographic risk factors for suicide: male gender Patient has the following protective factors against suicide: Supportive family  Thank you for this consult request. Recommendations have been communicated to the primary team.  We will sign off at this time.   Jeraline Moment, NP       History of Present Illness  Relevant Aspects of Hospital ED Course:  Admitted on 12/25/2023 for brought in from home by EMS initially for back pain and subsequently family requested help with patient agitation. He carries the psychiatric diagnoses of dementia, PTSD, MDD, alcohol abuse and GAD and has a past medical history of DVT, CVA, HTN, malignant neoplasm of stomach, GERD, osteoporosis, hearing loss, degenerative joint disease, AKI, psoriasis and left total knee replacement.   His current presentation of impaired memory, altered mental status and sundowning agitation is most consistent with his current dementia diagnosis. He meets criteria for skilled nursing facility placement based on patient's current condition and level of care needed; family is also interested in a hospice consult.  Current outpatient psychotropic medications include lexapro , ativan , remeron  and zyprexa  and historically he has had an average response to these medications. He was compliant with medications prior to admission as evidenced by family report. On initial examination, patient is calm, but unable to answer questions. Please see plan below for detailed recommendations.   Patient Report:   Jeffrey Robles, is seen face to face by this provider, consulted with Dr. Deborah Falling; and chart reviewed on 12/25/23.  On evaluation Riely Baskett is unable to answer questions.  He does respond to his name.  Patient goes by "[place]" and he is hard of hearing.  He lives with his wife and most recently they were both staying at their son's home because patient's wife is unable to manage patient at home alone.  Family states patient is behaviorally ok during the daytime hours, but becomes very agitated and aggressive at night usually around 1 am and stays that way until morning.  Family reports patient is wondering and is unsteady.  Patient's wife and daughter stated that it isn't safe to take patient home due to his worsening dementia.  The family is asking for a hospice consult and help with having patient admitted to a nursing home.  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 He and his wife are from [place]  and moved back to [place] from [place]  to be closer to family due to the patient's decline.  Patient reports he is "a little sore," but is unable to articulate what is sore or provide any further detail.  Patient is unable to answer orientation questions.  He not able to participate in an assessment.   Psych  ROS:  Depression: UTA Anxiety:  UTA Mania (lifetime and current): family denies Psychosis: (lifetime and current): family denies  Collateral information:  Contacted Maxmilian Trostel, spouse, was present during assessment and provided much of the above information.  Review of Systems  HENT:  Positive for hearing loss.   Psychiatric/Behavioral:  Positive for memory loss.   All other systems reviewed and are negative.    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 Hx: none Living Situation: lives with wife Spiritual Hx: none noted Access to weapons/lethal means: denies   Substance History Alcohol: regular alcohol use since 78 years old  Type of alcohol: beer only for last 6-8 months Last Drink yesterday Number of drinks per day 6-8 History of alcohol withdrawal seizures denies History of DT's denies Tobacco: unknown Illicit drugs: denies Prescription drug abuse: denies Rehab hx: denies  Exam Findings  Physical Exam:  Vital Signs:  Temp:  [98.1 F (36.7 C)] 98.1 F (36.7 C) (04/19 0900) Pulse Rate:  [63-86] 78 (04/19 1130) Resp:  [12-18] 18 (04/19 1130) BP: (95-112)/(60-91) 112/91 (04/19 1300) SpO2:  [96 %-100 %] 100 % (04/19 1130) Blood pressure (!) 112/91, pulse 78, temperature 98.1 F (36.7 C), temperature source Oral, resp. rate 18, SpO2 100%. There is no height or weight on file to calculate BMI.  Physical Exam  Mental Status Exam: General Appearance: Disheveled  Orientation:  Other:  oriented to self  Memory:  Immediate;   Negative Recent;   Negative Remote;   Negative  Concentration:  Concentration: Poor  Recall:  Negative  Attention  Poor  Eye Contact:  Minimal  Speech:  Garbled  Language:  Poor  Volume:  Decreased  Mood: UTA  Affect:   UTA  Thought Process:  UTA  Thought Content:   UTA  Suicidal Thoughts:   UTA  Homicidal Thoughts:   UTA  Judgement:  Other:  UTA  Insight:   UTA  Psychomotor Activity:  Restlessness  Akathisia:  No  Fund of Knowledge:   UTA      Assets:  Housing Leisure Time Social Support  Cognition:  Impaired,  Severe  ADL's:  Impaired  AIMS (if indicated):        Other History   These have been  pulled in through the EMR, reviewed, and updated if appropriate.  Family History:  The patient's family history is not on file.  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    Surgical History: Past Surgical History:  Procedure Laterality Date   COLONOSCOPY WITH PROPOFOL  N/A 12/23/2022   Procedure: COLONOSCOPY WITH PROPOFOL ;  Surgeon: Toledo, Alphonsus Jeans, MD;  Location: [facility] ENDOSCOPY;  Service: Gastroenterology;  Laterality: N/A;   ESOPHAGOGASTRODUODENOSCOPY (EGD) WITH PROPOFOL  N/A 12/23/2022   Procedure: ESOPHAGOGASTRODUODENOSCOPY (EGD) WITH PROPOFOL ;  Surgeon: Toledo, Alphonsus Jeans, MD;  Location: [facility] ENDOSCOPY;  Service: Gastroenterology;  Laterality: N/A;   GASTRIC RESECTION  Medications:   Current Facility-Administered Medications:    escitalopram  (LEXAPRO ) tablet 10 mg, 10 mg, Oral, Daily, Jessup, Charles, MD, 10 mg at 12/25/23 1256   folic acid  (FOLVITE ) tablet 1 mg, 1 mg, Oral, Daily, Jessup, Charles, MD, 1 mg at 12/25/23 1256   lactulose  (CHRONULAC ) 10 GM/15ML solution 10 g, 10 g, Oral, Daily, Jessup, Charles, MD, 10 g at 12/25/23 1256   LORazepam  (ATIVAN ) tablet 0.5 mg, 0.5 mg, Oral, Daily PRN, Jessup, Charles, MD   magnesium  oxide (MAG-OX) tablet 400 mg, 400 mg, Oral, BID, Jessup, Charles, MD   melatonin tablet 2.5 mg, 2.5 mg, Oral, QHS, Jessup, Charles, MD   midodrine  (PROAMATINE ) tablet 5 mg, 5 mg, Oral, TID WC, Jessup, Charles, MD, 5 mg at 12/25/23 1256   mirtazapine  (REMERON ) tablet 7.5 mg, 7.5 mg, Oral, QHS, Twilla Galea, MD   OLANZapine  (ZYPREXA ) tablet 15 mg, 15 mg, Oral, QHS, Jessup, Charles, MD   pantoprazole  (PROTONIX ) EC tablet 40 mg, 40 mg, Oral, Daily, Jessup, Charles, MD, 40 mg at 12/25/23 1256   [START ON 12/31/2023] Vitamin D  (Ergocalciferol ) (DRISDOL ) 1.25 MG (50000 UNIT) capsule 50,000 Units,  50,000 Units, Oral, Q7 days, Twilla Galea, MD  Current Outpatient Medications:    escitalopram  (LEXAPRO ) 10 MG tablet, Take 10 mg by mouth daily., Disp: , Rfl:    folic acid  (FOLVITE ) 1 MG tablet, Take 1 mg by mouth daily., Disp: , Rfl:    lactulose , encephalopathy, (CHRONULAC ) 10 GM/15ML SOLN, Take 15 mLs by mouth daily., Disp: , Rfl:    lidocaine  (LIDODERM ) 5 %, Place 1 patch onto the skin daily. Remove & Discard patch within 12 hours or as directed by MD, Disp: 30 patch, Rfl: 0   LORazepam  (ATIVAN ) 0.5 MG tablet, Take 0.5 mg by mouth daily as needed., Disp: , Rfl:    magnesium  oxide (MAG-OX) 400 MG tablet, Take 400 mg by mouth 2 (two) times daily., Disp: , Rfl:    Melatonin 3 MG CAPS, Take 3 mg by mouth at bedtime., Disp: , Rfl:    midodrine  (PROAMATINE ) 5 MG tablet, Take 5 mg by mouth 3 (three) times daily with meals., Disp: , Rfl:    mirtazapine  (REMERON ) 7.5 MG tablet, Take 7.5 mg by mouth at bedtime., Disp: , Rfl:    nitroGLYCERIN (NITROSTAT) 0.4 MG SL tablet, Place 0.4 mg under the tongue every 5 (five) minutes as needed., Disp: , Rfl:    OLANZapine  (ZYPREXA ) 5 MG tablet, Take 15 mg by mouth at bedtime., Disp: , Rfl:    RABEprazole (ACIPHEX) 20 MG tablet, Take 40 mg by mouth daily., Disp: , Rfl:    Vitamin D , Ergocalciferol , (DRISDOL ) 1.25 MG (50000 UNIT) CAPS capsule, Take 50,000 Units by mouth every 7 (seven) days., Disp: , Rfl:    aspirin 325 MG tablet, Take 325 mg by mouth daily. (Patient not taking: Reported on 12/23/2022), Disp: , Rfl:    atorvastatin (LIPITOR) 20 MG tablet, Take 20 mg by mouth daily. (Patient not taking: Reported on 12/23/2022), Disp: , Rfl:    DULoxetine (CYMBALTA) 60 MG capsule, Take 60 mg by mouth daily. (Patient not taking: Reported on 12/25/2023), Disp: , Rfl:    ibuprofen  (ADVIL ) 400 MG tablet, Take 1 tablet (400 mg total) by mouth every 6 (six) hours as needed. (Patient not taking: Reported on 12/25/2023), Disp: 30 tablet, Rfl: 0   metoprolol succinate  (TOPROL-XL) 50 MG 24 hr tablet, Take 50 mg by mouth daily. (Patient not taking: Reported on 12/25/2023), Disp: , Rfl:  metoprolol tartrate (LOPRESSOR) 50 MG tablet, Take 50 mg by mouth 2 (two) times daily. (Patient not taking: Reported on 12/25/2023), Disp: , Rfl:    omeprazole (PRILOSEC OTC) 20 MG tablet, Take 40 mg by mouth daily. (Patient not taking: Reported on 12/25/2023), Disp: , Rfl:    tamsulosin (FLOMAX) 0.4 MG CAPS capsule, Take 0.4 mg by mouth daily. (Patient not taking: Reported on 12/25/2023), Disp: , Rfl:   Allergies: No Known Allergies  Deshonna Trnka A Leroy Pettway, NP

## 2023-12-25 NOTE — ED Notes (Signed)
 Orange long sleeve shirt and black jacket taken off and placed at bedside in a pt belongings bag.

## 2023-12-25 NOTE — ED Notes (Signed)
 After speaking with the pt's spouse Ms. 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 Pt daughter from [place] did sts he takes is meds whole ion a medicine cup. The pt also attempts to get out of bed often and needs help and usually uses the walker.In the am pt also sits in a recliner chair in the day time. Pt's spouse also sts they have been applying hemorrhoidal cream for the pt's hemorrhoids. He is lactose and intolerant but he does like bacon, buttered toast, and eggs for breakfast. Per daughter he not fond of chocolate and the ensure. Pt spouse was given an update on the plan. Per note read the [facility] but until then pt will be a SW hold in the ED.

## 2023-12-25 NOTE — ED Provider Notes (Signed)
 [facility] Provider Note    Event Date/Time   First MD Initiated Contact with Patient 12/25/23 [phone]     (approximate)   History   Chief Complaint Agitation   HPI  Jeffrey Robles is a 78 y.o. male with past medical history of hypertension, stroke, GERD, and dementia who presents to the ED for agitation.  Per daughter at bedside, patient was more agitated than usual this morning, wanting to go to bed but refusing to actually do so when prompted.  She states that he was wandering around the house, had attempted to go up the stairs and almost fell, however she was able to catch him.  He became increasingly agitated at this and refused to take his usual dose of Ativan .  Family contacted EMS at this point for transport to the ED.  Family does state that patient has advanced dementia and typically deals with intermittent agitation, usually managed with regular olanzapine .  They state that he is typically weak, denies any weakness beyond his baseline today and denies any change in his mental status.  Patient currently denies any complaints, is not sure why he is here.     Physical Exam   Triage Vital Signs: ED Triage Vitals [12/25/23 0900]  Encounter Vitals Group     BP 100/72     Systolic BP Percentile      Diastolic BP Percentile      Pulse Rate 86     Resp 18     Temp 98.1 F (36.7 C)     Temp Source Oral     SpO2 99 %     Weight      Height      Head Circumference      Peak Flow      Pain Score      Pain Loc      Pain Education      Exclude from Growth Chart     Most recent vital signs: Vitals:   12/25/23 1106 12/25/23 1130  BP: 102/64 97/72  Pulse: 81 78  Resp: 14 18  Temp:    SpO2: 100% 100%    Constitutional: Alert and oriented to person, but not place, time, or situation. Eyes: Conjunctivae are normal. Head: Atraumatic. Nose: No congestion/rhinnorhea. Mouth/Throat: Mucous membranes are moist.  Cardiovascular: Normal rate, regular  rhythm. Grossly normal heart sounds.  2+ radial pulses bilaterally. Respiratory: Normal respiratory effort.  No retractions. Lungs CTAB. Gastrointestinal: Soft and nontender. No distention. Musculoskeletal: No lower extremity tenderness nor edema.  Neurologic:  Normal speech and language. No gross focal neurologic deficits are appreciated.    ED Results / Procedures / Treatments   Labs (all labs ordered are listed, but only abnormal results are displayed) Labs Reviewed  CBC WITH DIFFERENTIAL/PLATELET - Abnormal; Notable for the following components:      Result Value   RBC 2.86 (*)    Hemoglobin 10.1 (*)    HCT 29.5 (*)    MCV 103.1 (*)    MCH 35.3 (*)    All other components within normal limits  COMPREHENSIVE METABOLIC PANEL WITH GFR - Abnormal; Notable for the following components:   Sodium 131 (*)    CO2 21 (*)    Calcium 8.3 (*)    Total Protein 5.9 (*)    Albumin 1.9 (*)    AST 74 (*)    Total Bilirubin 2.6 (*)    All other components within normal limits  URINALYSIS, ROUTINE W REFLEX  MICROSCOPIC - Abnormal; Notable for the following components:   Color, Urine YELLOW (*)    APPearance CLEAR (*)    Specific Gravity, Urine 1.004 (*)    All other components within normal limits  AMMONIA - Abnormal; Notable for the following components:   Ammonia 65 (*)    All other components within normal limits     EKG  ED ECG REPORT I, Twilla Galea, the attending physician, personally viewed and interpreted this ECG.   Date: 12/25/2023  EKG Time: 9:07  Rate: 106  Rhythm: sinus tachycardia  Axis: Normal  Intervals:none  ST&T Change: None  ED ECG REPORT I, Twilla Galea, the attending physician, personally viewed and interpreted this ECG.   Date: 12/25/2023  EKG Time: 9:48  Rate: 91  Rhythm: normal sinus rhythm  Axis: LAD  Intervals: Prolonged QT  ST&T Change: None   PROCEDURES:  Critical Care performed: No  Procedures   MEDICATIONS ORDERED IN  ED: Medications  escitalopram  (LEXAPRO ) tablet 10 mg (has no administration in time range)  folic acid  (FOLVITE ) tablet 1 mg (has no administration in time range)  lactulose  (encephalopathy) (CHRONULAC ) 10 GM/15ML solution 10 g (has no administration in time range)  LORazepam  (ATIVAN ) tablet 0.5 mg (has no administration in time range)  magnesium  oxide (MAG-OX) tablet 400 mg (has no administration in time range)  Melatonin CAPS 3 mg (has no administration in time range)  midodrine  (PROAMATINE ) tablet 5 mg (has no administration in time range)  mirtazapine  (REMERON ) tablet 7.5 mg (has no administration in time range)  OLANZapine  (ZYPREXA ) tablet 15 mg (has no administration in time range)  pantoprazole  (PROTONIX ) EC tablet 40 mg (has no administration in time range)  Vitamin D  (Ergocalciferol ) (DRISDOL ) 1.25 MG (50000 UNIT) capsule 50,000 Units (has no administration in time range)  LORazepam  (ATIVAN ) injection 1 mg (1 mg Intravenous Given 12/25/23 0944)     IMPRESSION / MDM / ASSESSMENT AND PLAN / ED COURSE  I reviewed the triage vital signs and the nursing notes.                              78 y.o. male with past medical history of hypertension, stroke, GERD, and dementia who presents to the ED for increased agitation this morning and near fall, at which point family called him.  Patient's presentation is most consistent with acute presentation with potential threat to life or bodily function.  Differential diagnosis includes, but is not limited to, dementia, agitation, anemia, electrolyte abnormality, AKI, UTI.  Patient nontoxic-appearing and in no acute distress, vital signs are unremarkable.  He denies any complaints and is not sure why he is here, appears to be at his baseline mental status with no focal neurologic deficits.  EKG shows no evidence of arrhythmia or ischemia, labs results are pending at this time but urinalysis shows no evidence of UTI.  Patient became increasingly  agitated here in the ED, had refused to take his Ativan  at home and will give dose of IV Ativan .  Labs with mild anemia, no significant leukocytosis, electrolyte abnormality, or AKI.  Ammonia mildly elevated but doubt his presentation today is related to hepatic encephalopathy.  CT head performed and negative for acute process.  We will restart his home lactulose  in addition to other home medications.  Patient would benefit from psychiatric evaluation due to increasing agitation in the setting of dementia.  The patient has been placed in psychiatric observation due  to the need to provide a safe environment for the patient while obtaining psychiatric consultation and evaluation, as well as ongoing medical and medication management to treat the patient's condition.  The patient has not been placed under full IVC at this time.       FINAL CLINICAL IMPRESSION(S) / ED DIAGNOSES   Final diagnoses:  Agitation  Dementia with agitation, unspecified dementia severity, unspecified dementia type (HCC)     Rx / DC Orders   ED Discharge Orders     None        Note:  This document was prepared using Dragon voice recognition software and may include unintentional dictation errors.   Twilla Galea, MD 12/25/23 1146

## 2023-12-25 NOTE — ED Triage Notes (Signed)
 [facility] reports pt coming from home for back pain. Family states they were walking with pt and pt became weak and family lowered pt to the floor. Pt c/o back pain. Pt has hx of compression fractures. Pt has dementia and alert to self only. Family requesting pt be evaluated.

## 2023-12-25 NOTE — ED Notes (Signed)
 Pt refusing to wake up and take PO meds at this time.

## 2023-12-25 NOTE — Consult Note (Signed)
 Consultation Note Date: 12/25/2023 at 1600  Patient Name: Jeffrey Robles  DOB: [birth year]  MRN: [id number]  Age / Sex: 78 y.o., male  PCP: Patient, No Pcp Per Referring Physician: Twilla Galea, MD  HPI/Patient Profile: 78 y.o. male  with past medical history of HTN, CVA, GERD, gastric cancer s/p gastrectomy and dementia. He presented to ED 12/25/23 via EMS c/o agitation. Family at bedside reports that patient has advanced dementia that is usually managed with medication. Today, patient became agitated and was physically aggressive. Due to safety concerns of patient, family members and small children in the home, they called EMS for help.   ED workup showed: Results for orders placed or performed during the hospital encounter of 12/25/23 (from the past 24 hours)  CBC with Differential     Status: Abnormal   Collection Time: 12/25/23  9:07 AM  Result Value Ref Range   WBC 5.2 4.0 - 10.5 K/uL   RBC 2.86 (L) 4.22 - 5.81 MIL/uL   Hemoglobin 10.1 (L) 13.0 - 17.0 g/dL   HCT 02.7 (L) 25.3 - 66.4 %   MCV 103.1 (H) 80.0 - 100.0 fL   MCH 35.3 (H) 26.0 - 34.0 pg   MCHC 34.2 30.0 - 36.0 g/dL   RDW 40.3 47.4 - 25.9 %   Platelets 257 150 - 400 K/uL   nRBC 0.0 0.0 - 0.2 %   Neutrophils Relative % 60 %   Neutro Abs 3.2 1.7 - 7.7 K/uL   Lymphocytes Relative 22 %   Lymphs Abs 1.2 0.7 - 4.0 K/uL   Monocytes Relative 13 %   Monocytes Absolute 0.7 0.1 - 1.0 K/uL   Eosinophils Relative 3 %   Eosinophils Absolute 0.1 0.0 - 0.5 K/uL   Basophils Relative 2 %   Basophils Absolute 0.1 0.0 - 0.1 K/uL   Immature Granulocytes 0 %   Abs Immature Granulocytes 0.01 0.00 - 0.07 K/uL  Comprehensive metabolic panel     Status: Abnormal   Collection Time: 12/25/23  9:07 AM  Result Value Ref Range   Sodium 131 (L) 135 - 145 mmol/L   Potassium 4.5 3.5 - 5.1 mmol/L   Chloride 102 98 - 111 mmol/L   CO2 21 (L) 22 - 32 mmol/L    Glucose, Bld 78 70 - 99 mg/dL   BUN 12 8 - 23 mg/dL   Creatinine, Ser 5.63 0.61 - 1.24 mg/dL   Calcium 8.3 (L) 8.9 - 10.3 mg/dL   Total Protein 5.9 (L) 6.5 - 8.1 g/dL   Albumin 1.9 (L) 3.5 - 5.0 g/dL   AST 74 (H) 15 - 41 U/L   ALT 25 0 - 44 U/L   Alkaline Phosphatase 63 38 - 126 U/L   Total Bilirubin 2.6 (H) 0.0 - 1.2 mg/dL   GFR, Estimated >87 >56 mL/min   Anion gap 8 5 - 15  Urinalysis, Routine w reflex microscopic -Urine, Clean Catch     Status: Abnormal   Collection Time: 12/25/23  9:07 AM  Result Value Ref  Range   Color, Urine YELLOW (A) YELLOW   APPearance CLEAR (A) CLEAR   Specific Gravity, Urine 1.004 (L) 1.005 - 1.030   pH 6.0 5.0 - 8.0   Glucose, UA NEGATIVE NEGATIVE mg/dL   Hgb urine dipstick NEGATIVE NEGATIVE   Bilirubin Urine NEGATIVE NEGATIVE   Ketones, ur NEGATIVE NEGATIVE mg/dL   Protein, ur NEGATIVE NEGATIVE mg/dL   Nitrite NEGATIVE NEGATIVE   Leukocytes,Ua NEGATIVE NEGATIVE  Ammonia     Status: Abnormal   Collection Time: 12/25/23 10:02 AM  Result Value Ref Range   Ammonia 65 (H) 9 - 35 umol/L    Palliative consulted for goals of care.   Clinical Assessment and Goals of Care: Extensive chart review completed prior to meeting patient including labs, vital signs, imaging, progress notes, orders, and available advanced directive documents from current and previous encounters. I then met with patient, wife and daughter to discuss diagnosis prognosis, GOC, EOL wishes, disposition and options.  Ill-appearing, elderly male resting in bed with wife and daughter at bedside. He is alert and appears agitated, but unable to answer questions appropriately. He is in no distress.   After locating to conference room with family, I introduced Palliative Medicine as specialized medical care for people living with serious illness. It focuses on providing relief from the symptoms and stress of a serious illness. The goal is to improve quality of life for both the patient and the  family.  We discussed a brief life review of the patient. Mr. Jeffrey Robles served in the [employer] for 22 years. He and his wife Devra Fontana have 4 children and multiple grandchildren. They now live with their son in [place].   As far as functional and nutritional status Devra Fontana states that Jeffrey Robles has experienced significant decline related to his dementia over the past several months with rapid decline over the last 2 weeks. He ambulates at home with walker but forgets to use it increasing his risk for falls. He requires assistance in getting to bathroom, but wears Depends for accidents which are becoming more frequent. She reports that after his recent hospitalization at [facility] for low sodium levels, he has started to eat more and snack between meals.   We discussed patient's current illness and what it means in the larger context of patient's on-going co-morbidities.  Natural disease trajectory and expectations at EOL were discussed.  I attempted to elicit values and goals of care important to the patient. Devra Fontana shares that her husband did not want to go to facility and it was important to honor that wish, but now feels that her husband will require a higher level of care than she can provide for him. She is fearful for the safety of her husband and family.   The difference between aggressive medical intervention and comfort care was considered in light of the patient's goals of care.   Advance directives, concepts specific to code status, artificial feeding and hydration, and rehospitalization were considered and discussed. Wife and daughter share that Mr. Jeffrey Robles is a DNR in the [facility] system. They want his DNR status to continue for this hospitalization. They would not want a feeding tube for him but want medical treatment to continue such as fluids, antibiotics and medications.   Education offered regarding concept specific to human mortality and the limitations of medical interventions to prolong life when the  body begins to fail to thrive. Both understand that dementia is a progressive disease that will worsen. Devra Fontana verbalizes that her husband is nearing  end stages of his dementia.   Devra Fontana expresses concern that she is not able to care for her husband any longer due to his rapid decline in function related to dementia, physical aggression toward family members, not wanting to sleep, uncooperative with requests and recent inability to recognize family members. She shares that sometimes he thinks he's still in the [employer]. He also looks for keys to car to leave, prompting family to hide keys and car. He is now requiring 24/7 care which is greatly impacting Devra Fontana and their family.   Family is facing treatment option decisions, advanced directive, and anticipatory care needs. Devra Fontana and daughter clarify that they would not want CPR or other life support for the patient.   Discussed with patient/family the importance of continued conversation with family and the medical providers regarding overall plan of care and treatment options, ensuring decisions are within the context of the patient's values and GOCs.    Devra Fontana asks about Hospice care in the home. She was advised that in-home care with hospice would not be an option since most of the care of her husband would be the responsibility of family with only occasional visit from Hospice staff.   Questions and concerns were addressed. The family was encouraged to call with questions or concerns.   Primary Decision Maker NEXT OF KIN- Devra Fontana- Wife  Physical Exam Vitals reviewed.  Constitutional:      General: He is not in acute distress.    Appearance: He is ill-appearing.  Pulmonary:     Effort: Pulmonary effort is normal. No respiratory distress.  Musculoskeletal:     Right lower leg: Edema present.     Left lower leg: Edema present.  Skin:    General: Skin is warm and dry.  Neurological:     Mental Status: He is alert. He is  disoriented.   Recommendations/Plan: DNR/DNI   Continue current supportive interventions [facility]   Palliative Assessment/Data: 40-50%   Discussed plan of care with primary RN and Meagan, TOC.  Thank you for this consult. Palliative medicine will continue to follow and assist holistically.   Time Total: 75 minutes  Time spent includes: Detailed review of medical records (labs, imaging, vital signs), medically appropriate exam (mental status, respiratory, cardiac, skin), discussed with treatment team, counseling and educating patient, family and staff, documenting clinical information, medication management and coordination of care.     Ina Manas, Joyice Nodal- [facility] Palliative Medicine Team  12/25/2023 3:26 PM  Office [phone]  Pager [phone]     Please contact Palliative Medicine Team providers via AMION for questions and concerns.

## 2023-12-25 NOTE — ED Notes (Signed)
 This tech entered pts room due to fall alarm going off. This writer found pt to be sitting on edge of the bed. Pt had tore pure wick off and was completely undressed. This Community education officer, RN assisted pt withy getting cleaned up, dressed and new purewick placed on pt.

## 2023-12-25 NOTE — BH Assessment (Signed)
 Comprehensive Clinical Assessment (CCA) Screening, Triage and Referral Note  12/25/2023 Jeffrey Robles [id number]  Chief Complaint:  Chief Complaint  Patient presents with   Agitation   Visit Diagnosis: Dementia with behavioral disturbance (HCC)  Jeffrey Robles is a 78 year old male who presents to the ER via his family due to the changes in his behaviors and his complaints of having pain. Patient has a diagnosis of dementia and his symptoms are worsening and family are no longer able to manage him safely at the home. He was living in the same home with his wife. However, following his recent admission, upon discharged, he and his wife moved in with their son. Due to his symptoms and having three small children, the son is unable to provide the care for his father. Majority of the information for this consult was provided by the patient's family.  Patient Reported Information How did you hear about us ? Family/Friend  What Is the Reason for Your Visit/Call Today? Patient brought to the ER via his family due to the changes in his behaviors and his complaints of having pain.  How Long Has This Been Causing You Problems? 1 wk - 1 month  What Do You Feel Would Help You the Most Today? Treatment for Depression or other mood problem   Have You Recently Had Any Thoughts About Hurting Yourself? No  Are You Planning to Commit Suicide/Harm Yourself At This time? No   Have you Recently Had Thoughts About Hurting Someone Jeffrey Robles? No  Are You Planning to Harm Someone at This Time? No  Explanation: No data recorded  Have You Used Any Alcohol or Drugs in the Past 24 Hours? No  How Long Ago Did You Use Drugs or Alcohol? No data recorded What Did You Use and How Much? No data recorded  Do You Currently Have a Therapist/Psychiatrist? No  Name of Therapist/Psychiatrist: No data recorded  Have You Been Recently Discharged From Any Office Practice or Programs? No  Explanation of Discharge From  Practice/Program: No data recorded   CCA Screening Triage Referral Assessment Type of Contact: Face-to-Face  Telemedicine Service Delivery:   Is this Initial or Reassessment?   Date Telepsych consult ordered in [facility]:    Time Telepsych consult ordered in [facility]:    Location of Assessment: [facility] ED  Provider Location: [facility] ED    Collateral Involvement: Patient's wife and daughter, both were in the room.   Does Patient Have a Automotive engineer Guardian? No data recorded Name and Contact of Legal Guardian: No data recorded If Minor and Not Living with Parent(s), Who has Custody? No data recorded Is CPS involved or ever been involved? Never  Is APS involved or ever been involved? Never   Patient Determined To Be At Risk for Harm To Self or Others Based on Review of Patient Reported Information or Presenting Complaint? No  Method: No data recorded Availability of Means: No data recorded Intent: No data recorded Notification Required: No data recorded Additional Information for Danger to Others Potential: No data recorded Additional Comments for Danger to Others Potential: No data recorded Are There Guns or Other Weapons in Your Home? No  Types of Guns/Weapons: No data recorded Are These Weapons Safely Secured?                            No  Who Could Verify You Are Able To Have These Secured: No data recorded Do You Have any Outstanding Charges,  Pending Court Dates, Parole/Probation? No data recorded Contacted To Inform of Risk of Harm To Self or Others: No data recorded  Does Patient Present under Involuntary Commitment? No    [place] of Residence: [place]   Patient Currently Receiving the Following Services: Medication Management   Determination of Need: Emergent (2 hours)   Options For Referral: Other: Comment   Disposition Recommendation per psychiatric provider: There are no psychiatric contraindications to discharge at this time  Bryce Captain MS, LCAS,  [facility], [facility] Therapeutic Triage Specialist 12/25/2023 2:51 PM

## 2023-12-26 DIAGNOSIS — Z66 Do not resuscitate: Secondary | ICD-10-CM | POA: Diagnosis not present

## 2023-12-26 DIAGNOSIS — Z7189 Other specified counseling: Secondary | ICD-10-CM | POA: Diagnosis not present

## 2023-12-26 DIAGNOSIS — R451 Restlessness and agitation: Secondary | ICD-10-CM | POA: Diagnosis not present

## 2023-12-26 DIAGNOSIS — Z515 Encounter for palliative care: Secondary | ICD-10-CM | POA: Diagnosis not present

## 2023-12-26 DIAGNOSIS — Z711 Person with feared health complaint in whom no diagnosis is made: Secondary | ICD-10-CM

## 2023-12-26 MED ORDER — OLANZAPINE 10 MG IM SOLR
5.0000 mg | Freq: Once | INTRAMUSCULAR | Status: AC
  Administered 2023-12-26: 5 mg via INTRAMUSCULAR
  Filled 2023-12-26: qty 10

## 2023-12-26 NOTE — ED Notes (Signed)
 Pt is laying in bed sitting quietly with sitter at the bedside. No respiratory distress observed.

## 2023-12-26 NOTE — ED Notes (Signed)
 Pt is resting comfortably with eyes closed. RR equal and unlabored with chest rise and fall.

## 2023-12-26 NOTE — Progress Notes (Signed)
 Palliative Care Progress Note, Assessment & Plan   Patient Name: Jeffrey Robles       Date: 12/26/2023 DOB: September 07, 1946  Age: 78 y.o. MRN#: [id number] Attending Physician: No att. providers found Primary Care Physician: Patient, No Pcp Per Admit Date: 12/25/2023  Subjective: Unable to assess  HPI: 77 y.o. male  with past medical history of HTN, CVA, GERD, gastric cancer s/p gastrectomy and dementia. He presented to ED 12/25/23 via EMS c/o agitation. Family at bedside reports that patient has advanced dementia that is usually managed with medication. Today, patient became agitated and was physically aggressive. Due to safety concerns of patient, family members and small children in the home, they called EMS for help.   PMT consulted to discuss GOC in the setting of rapid decline due to dementia.    Summary of counseling/coordination of care: Extensive chart review completed prior to meeting patient including labs, vital signs, imaging, progress notes, orders, and available advanced directive documents from current and previous encounters.   After reviewing the patient's chart and assessing the patient at bedside.  Pt resting in bed with sitter at bedside. Safety mitts in place bilaterally. He appears agitated. He is in no distress. Sitter advises that patient has been agitated and attempting to grab medical equipment. He is in no distress.   1500 Visited with wife and daughter at bedside. Patient's wife, daughter and I relocated to quiet waiting area. Both agree that Jeffrey Robles has seem to have decline overnight from his status yesterday. I agreed and discussed the expectation with decline due to trying to keep him safe. Both verbalize that he is suffering and said he never wanted to live like this but feel that  they had no choice since he had become physically aggressive needing a higher level of care that they are not able to provide. We discussed SNF versus hospice care. They both agree that they are "ready to let him go" when he is appropriate. Both understand that in the interim, TOC will be working on placement for Jeffrey Robles. Wife and daughter express appreciation for update.   Therapeutic silence and active listening provided for wife and daughter to share their thoughts and emotions regarding current medical situation.  Emotional support provided.  Physical Exam Vitals reviewed.  Constitutional:      General: He is not in acute distress.    Appearance: He is ill-appearing.     Comments: Safety mitts in place bilat  Pulmonary:     Effort: Pulmonary effort is normal. No respiratory distress.  Musculoskeletal:     Right lower leg: No edema.     Left lower leg: No edema.  Neurological:     Mental Status: He is disoriented.     Recommendations/Plan: DNR/DNI   Continue current supportive interventions [facility] H[facility]         Total Time 50 minutes   Time spent includes: Detailed review of medical records (labs, imaging, vital signs), medically appropriate exam (mental status, respiratory, cardiac, skin), discussed with treatment team, counseling and educating patient, family and staff, documenting clinical information, medication management and coordination of care.     Ina Manas, AGNP- [facility]  Palliative Medicine Team  12/26/2023 8:30 AM  Office [phone]  Pager [phone]

## 2023-12-26 NOTE — ED Notes (Signed)
 Mr. Jeffrey Robles was transferred to a hospital bed by staff to provide more comfort. Pt tolerated it well.

## 2023-12-26 NOTE — ED Notes (Signed)
 Mittens had to be placed on pt d/t him pulling at medical devices and attempting several times to get out of bed without assistance. Sitter is at the bedside for safety reasons.

## 2023-12-26 NOTE — ED Notes (Signed)
 Pt is in bed with mittens aiming at the ceiling with arms aimlessly. No discomfort observed. Will continue to monitor.

## 2023-12-26 NOTE — ED Provider Notes (Signed)
 Emergency Medicine Observation Re-evaluation Note  Physical Exam   BP 116/85 (BP Location: Right Arm)   Pulse 100   Temp 97.6 F (36.4 C) (Oral)   Resp 16   SpO2 100%   Patient appears in no acute distress.  ED Course / MDM   No reported events during my shift at the time of this note.   Pt is awaiting dispo from consultants   Buell Carmin MD    Buell Carmin, MD 12/26/23 [phone]

## 2023-12-26 NOTE — ED Notes (Signed)
Assisted pt with eating breakfast  

## 2023-12-26 NOTE — ED Notes (Addendum)
 This tech at bedside as 1:1 sitter. Pt resting comfortably. Eyes closed with even and unlabored RR, chest rise and fall noted.

## 2023-12-26 NOTE — ED Notes (Signed)
 Wife at bedside.

## 2023-12-26 NOTE — ED Notes (Signed)
 Pt continues to be awake and attempting to remove mittens. Pt is laying on his right side and putting his legs over the stretcher rails. This tech repositioned pt back in bed.

## 2023-12-26 NOTE — ED Notes (Addendum)
 Breakfast tray delivered

## 2023-12-26 NOTE — Care Management Obs Status (Signed)
 MEDICARE OBSERVATION STATUS NOTIFICATION   Patient Details  Name: Chuck Caban MRN: [id number] Date of Birth: [birth year]   Medicare Observation Status Notification Given:  Yes    Zoe Hinds, RN 12/26/2023, 1:06 PM

## 2023-12-26 NOTE — ED Notes (Signed)
 [phone] Clay Cummins... if he is moved please call the wife

## 2023-12-26 NOTE — ED Notes (Signed)
 RN to bedside. Pt is resting with mitts in place. Family is meeting with NP.

## 2023-12-26 NOTE — ED Notes (Signed)
 This tech and Melissa, NT performed pericare. Dry brief and a new male purewick applied after the pt removed his previous purewick. Pt continues to try to get out of bed and taking his gown off. Hand mittens were applied for safety and to prevent pt for continuing pulling his purewick and taking his gown off. Bambi Lever, RN notified.

## 2023-12-26 NOTE — ED Notes (Addendum)
 Pt wife at bedside, she has brought snacks that pt likes. Wife states he has not had a BM in 4 days. Wife states it is better to put a little bit of the snacks in a small cup, as he likes to snack throughout the day. Wife states that the pt seems more "out of it" than normal- she is aware that he was given Ativan  by RN earlier.

## 2023-12-27 DIAGNOSIS — Z515 Encounter for palliative care: Secondary | ICD-10-CM | POA: Diagnosis not present

## 2023-12-27 DIAGNOSIS — Z7189 Other specified counseling: Secondary | ICD-10-CM | POA: Diagnosis not present

## 2023-12-27 DIAGNOSIS — Z66 Do not resuscitate: Secondary | ICD-10-CM | POA: Diagnosis not present

## 2023-12-27 DIAGNOSIS — R451 Restlessness and agitation: Secondary | ICD-10-CM | POA: Diagnosis not present

## 2023-12-27 LAB — CBG MONITORING, ED
Glucose-Capillary: 67 mg/dL — ABNORMAL LOW (ref 70–99)
Glucose-Capillary: 80 mg/dL (ref 70–99)
Glucose-Capillary: 74 mg/dL (ref 70–99)

## 2023-12-27 MED ORDER — SODIUM CHLORIDE 0.9 % IV BOLUS: 1000.0000 mL | Freq: Once | INTRAVENOUS | Status: DC

## 2023-12-27 MED ORDER — SODIUM CHLORIDE 0.9 % IV BOLUS
500.0000 mL | Freq: Once | INTRAVENOUS | Status: AC
  Administered 2023-12-27: 500 mL via INTRAVENOUS

## 2023-12-27 MED ORDER — ZIPRASIDONE MESYLATE 20 MG IM SOLR
20.0000 mg | Freq: Once | INTRAMUSCULAR | Status: AC
  Administered 2023-12-27: 20 mg via INTRAMUSCULAR
  Filled 2023-12-27: qty 20

## 2023-12-27 MED ORDER — DEXTROSE 10 % IV SOLN
250.0000 mL | Freq: Once | INTRAVENOUS | Status: AC
  Administered 2023-12-27: 250 mL via INTRAVENOUS
  Filled 2023-12-27: qty 250

## 2023-12-27 MED ORDER — SODIUM CHLORIDE 0.9 % IV BOLUS
1000.0000 mL | Freq: Once | INTRAVENOUS | Status: AC
  Administered 2023-12-27: 1000 mL via INTRAVENOUS

## 2023-12-27 NOTE — ED Notes (Signed)
SLP at bedside assessing pt.

## 2023-12-27 NOTE — ED Provider Notes (Signed)
-----------------------------------------   9:46 PM on 12/27/2023 ----------------------------------------- Patient is quite agitated tonight, is prescribed nighttime sedation medications, however patient is agitated and is unwilling to take any pills currently.  Will dose a one-time dose of IM Geodon  to see if we can help with the patient's agitation.   Ruth Cove, MD 12/27/23 2146

## 2023-12-27 NOTE — ED Provider Notes (Signed)
 Emergency Medicine Observation Re-evaluation Note  Physical Exam   BP (!) 122/91   Pulse 86   Temp (!) 97.4 F (36.3 C) (Axillary)   Resp 18   SpO2 100%   Patient appears in no acute distress.  ED Course / MDM   Apparently he had not eaten since a day ago and was made n.p.o. with pending speech pathology evaluation later today for swallow study.  He is sleeping comfortably and our nurse checked blood glucose which is in the 60s.  I ordered for a fluid bolus and a D10 bolus which should tide him over until he gets his swallow study and further dietary orders later today.  Pt is awaiting dispo from consultants   Buell Carmin MD    Buell Carmin, MD 12/27/23 [phone]

## 2023-12-27 NOTE — ED Notes (Signed)
 Patient ate all of his lunch. No signs of any agitation or distress.

## 2023-12-27 NOTE — ED Notes (Signed)
 Dr. Margery Sheets was informed that pt has been NPO over 12 hours. Per Dr. Margery Sheets obtain CBG.

## 2023-12-27 NOTE — TOC Progression Note (Addendum)
 Transition of Care [facility]) - Progression Note    Patient Details  Name: Jeffrey Robles MRN: [id number] Date of Birth: 26-Nov-1945  [facility] CM/SW Contact  Elmira Haddock, LCSW Phone Number: 12/27/2023, 4:51 PM  Clinical Narrative:   CSW received an Epic chat from Ina Manas, NP about family wanting to speak with Hospice.  CSW  asked Britta Candy to reach out to patient's wife.  He confirmed that he'd call tomorrow.  CSW reached out to patient's wife to ask if the family is interested in speaking with Fredrik Jensen from [facility].  She states that she would like that.  CSW informed her that Fredrik Jensen will call her tomorrow.    Expected Discharge Plan: Memory Care Barriers to Discharge: Continued Medical Work up  Expected Discharge Plan and Services       Living arrangements for the past 2 months: Single Family Home                                       Social Determinants of Health (SDOH) Interventions SDOH Screenings   Food Insecurity: No Food Insecurity (12/14/2023)   Received from [facility]  Housing: Low Risk  (10/01/2023)   Received from [facility]  Transportation Needs: No Transportation Needs (12/14/2023)   Received from [facility]  Utilities: Not At Risk (10/01/2023)   Received from [facility]  Financial Resource Strain: Low Risk  (12/14/2023)   Received from [facility]  Physical Activity: Inactive (04/15/2023)   Received from [facility]  Social Connections: Moderately Integrated (04/15/2023)   Received from [facility]  Stress: Stress Concern Present (04/15/2023)   Received from [facility]  Tobacco Use: Low Risk  (12/25/2023)  Recent Concern: Tobacco Use - Medium Risk (12/05/2023)   Received from [facility]  Health Literacy: Adequate Health Literacy (04/15/2023)   Received from [facility]    Readmission Risk Interventions     No data to display

## 2023-12-27 NOTE — Progress Notes (Signed)
 Palliative Care Progress Note, Assessment & Plan   Patient Name: Jeffrey Robles       Date: 12/27/2023 DOB: March 31, 1946  Age: 78 y.o. MRN#: [id number] Attending Physician: No att. providers found Primary Care Physician: Patient, No Pcp Per Admit Date: 12/25/2023  Subjective: Unable to assess  HPI: 78 y.o. male  with past medical history of HTN, CVA, GERD, gastric cancer s/p gastrectomy and dementia. He presented to ED 12/25/23 via EMS c/o agitation. Family at bedside reports that patient has advanced dementia that is usually managed with medication. Today, patient became agitated and was physically aggressive. Due to safety concerns of patient, family members and small children in the home, they called EMS for help.    PMT consulted to discuss GOC in the setting of rapid decline due to dementia.  Summary of counseling/coordination of care: Extensive chart review completed prior to meeting patient including labs, vital signs, imaging, progress notes, orders, and available advanced directive documents from current and previous encounters.   After reviewing the patient's chart and assessing the patient at bedside, I spoke with patient's son and daughter in law in regards to symptom management and goals of care.   Elderly, ill-appearing male resting in bed with safety mitts in place. Even, unlabored respirations. He is in no distress. Son and daughter in law at bedside.   Son expresses concern over the rapid decline of his father in just 3 days and asks if there is a time frame for when his father may die. Discussed there is no way to know, that with him eating and given medications for anxiety/aggression, he could potentially live for an extended time, but with poor quality of life due to skin breakdown, infections  and poor nutrition.  He asks about going home with hospice. [facility] most likely would want them to utilize the [place] for hospice care. I advised that I would reach out to our [facility] team and [facility] liaison for assistance.   Therapeutic silence and active listening provided for family to share their thoughts and emotions regarding current medical situation.  Emotional support provided.  Physical Exam Vitals reviewed.  Constitutional:      General: He is not in acute distress.    Appearance: He is ill-appearing.     Comments: Safety mitts in place   HENT:     Head: Normocephalic and atraumatic.  Pulmonary:     Effort: Pulmonary effort is normal. No respiratory distress.  Skin:    General: Skin is warm and dry.   Recommendations/Plan: DNR/DNI   Continue current supportive interventions TOC [facility] with [facility] with hospice [facility] liaison referral for assistance with hospice placement  Total Time 50 minutes   Time spent includes: Detailed review of medical records (labs, imaging, vital signs), medically appropriate exam (mental status, respiratory, cardiac, skin), discussed with treatment team, counseling and educating patient, family and staff, documenting clinical information, medication management and coordination of care.  58- Received message via Epic chat that patient was found to have hypotension in the 70's. RN questioned if treating hypotension or transitioning to comfort. Called Devra Fontana, wife, who advises that fluids may be given  to improve pressure until the patient's oldest son arrives later this evening to see his father even though she is aware and verbalizes that they are "prolonging the inevitable". Message relayed to RN that fluids are OK, but EDP would need to contact wife if further interventions are needed.     Ina Manas, Joyice Nodal [facility] Palliative Medicine Team  12/27/2023 9:13 AM  Office [phone]  Pager  [phone]

## 2023-12-27 NOTE — ED Notes (Signed)
 Notified EDP and palliative care NP about pt's BP 73/50. Waiting on response.

## 2023-12-27 NOTE — ED Notes (Signed)
 Dr. Margery Sheets was informed that the pt CBG was 56 and an IV was established. Orders was received.

## 2023-12-27 NOTE — Evaluation (Signed)
 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 male with past medical history of hypertension, stroke, GERD, and dementia who presents to the ED for agitation.  Per daughter at bedside, patient was more agitated than usual this morning, wanting to go to bed but refusing to actually do so when prompted.  She states that he was wandering around the house, had attempted to go up the stairs and almost fell, however she was able to catch him.  He became increasingly agitated at this and refused to take his usual dose of Ativan .  Family contacted EMS at this point for transport to the ED.  Family does state that patient has advanced dementia and typically deals with intermittent agitation, usually managed with regular olanzapine .  They state that he is typically weak, denies any weakness beyond his baseline today and denies any change in his mental status.  Patient currently denies any complaints, is not sure why he is here. Pt on regular  diet, though RN reporting that pt failed nursing swallow screen secondary to lethargy. CT Head 12/25/23: No acute intracranial abnormality identified.  2. Progressed since 2021 cerebral white matter disease, nonspecific  small focus of encephalomalacia in the anterior right middle frontal  gyrus, generalized cerebral volume loss.    Assessment / Plan / Recommendation  Clinical Impression  Pt seen for swallow assessment in the setting of concern for swallow function. Pt alert upon therapist entrance. Pt with baseline dementia, though able to respond with options of two, extended time, visual cues, and reduced distractions. No overt oral motor deficits. Oral care completed with noted thick secretions at base of tongue, NT reported congested cough. Dentures in place. Trials completed of thin liquids via straw, ice chips, puree, and regular solids. Mitts in place, hindering pts ability to participate in self feeding- total assist provided. No overt or subtle s/sx pharyngeal dysphagia noted. No change to vocal quality across trials. Vitals stable for duration of trials. Oral phase notable to prolonged oral manipulation with "munching" chewing pattern. Verbal, tactile cues required for liquid wash. Suspect that mentation directly impacts oral phase and efficiency with PO intake.       Given mentation, dependency with feeding, and deconditioning, pt is at increased risk for aspiration. Recommend aspiration precautions, slow rate, small bites, elevated HOB, and alert for PO intake. Reduce distractions, provide verbal/visual options for redirection and continuation of intake. Supervision for meals. Medications whole with thin liquid as able. RN aware of recommendations. Recommend Dys 2 and thin liquids. SLP will monitor for safety with diet initiation.  SLP Visit Diagnosis:  Dysphagia, oral phase (R13.11) (related to mentation)    Aspiration Risk  Mild aspiration risk    Diet Recommendation   Dysphagia 2  (chopped);Thin  Medication Administration: Whole meds with liquid    Other  Recommendations Oral Care Recommendations: Oral care BID;Staff/trained caregiver to provide oral care    Recommendations for follow up therapy are one component of a multi-disciplinary discharge planning process, led by the attending physician.  Recommendations may be updated based on patient status, additional functional criteria and insurance authorization.  Follow up Recommendations Follow physician's recommendations for discharge plan and follow up therapies      Assistance Recommended at Discharge  Assist and supervision for intake  Functional Status Assessment Patient has had a recent decline in their functional status and demonstrates the ability to make significant improvements in function in a reasonable and predictable amount of time.  Frequency and Duration min 1 x/week  1 week       Prognosis Prognosis for improved oropharyngeal function: Fair Barriers to Reach Goals: Cognitive deficits      Swallow Study   General Date of Onset: 12/27/23 HPI: Jeffrey Robles is a 78 y.o. male with past medical history of hypertension, stroke, GERD, and dementia who presents to the ED for agitation.  Per daughter at bedside, patient was more agitated than usual this morning, wanting to go to bed but refusing to actually do so when prompted.  She states that he was wandering around the house, had attempted to go up the stairs and almost fell, however she was able to catch him.  He became increasingly agitated at this and refused to take his usual dose of Ativan .  Family contacted EMS at this point for transport to the ED.  Family does state that patient has advanced dementia and typically deals with intermittent agitation, usually managed with regular olanzapine .  They state that he is typically weak, denies any weakness beyond his baseline today and denies any change in his mental status.  Patient currently denies any  complaints, is not sure why he is here. Pt on regular diet, though RN reporting that pt failed nursing swallow screen secondary to lethargy. CT Head 12/25/23: No acute intracranial abnormality identified.  2. Progressed since 2021 cerebral white matter disease, nonspecific  small focus of encephalomalacia in the anterior right middle frontal  gyrus, generalized cerebral volume loss. Type of Study: Bedside Swallow Evaluation Previous Swallow Assessment: none in chart Diet Prior to this Study: Regular;Thin liquids (Level 0) Temperature Spikes Noted: No (WBC 5.2) Respiratory Status: Room air History of Recent Intubation: No Behavior/Cognition: Alert;Requires cueing Oral Cavity Assessment: Excessive secretions (thick) Oral Care Completed by SLP: Yes Oral Cavity - Dentition: Dentures, bottom;Dentures, top Self-Feeding Abilities: Total assist Patient Positioning: Upright in bed Baseline Vocal Quality: Normal Volitional Cough: Cognitively unable to elicit Volitional Swallow: Unable to elicit    Oral/Motor/Sensory Function Overall Oral Motor/Sensory Function: Within functional limits   Ice Chips Ice chips: Within functional limits   Thin Liquid Thin Liquid: Within functional limits Presentation: Straw (needed extended time for seal and pull from straw)    Nectar Thick Nectar Thick Liquid: Not tested   Honey Thick Honey Thick Liquid: Not tested   Puree Puree: Within functional limits Presentation: Spoon   Solid     Solid: Impaired Presentation: Spoon Oral Phase Impairments: Poor awareness of bolus;Impaired mastication Oral Phase Functional Implications: Impaired mastication;Prolonged oral transit;Oral residue (expulsion noted x1) Pharyngeal Phase Impairments:  (none)       [place]  Moises Ang, MS, CCC-SLP Speech Language Pathologist Rehab Services; [facility] [phone] (ascom)   [place] J Clapp 12/27/2023,11:00 AM

## 2023-12-28 DIAGNOSIS — F03918 Unspecified dementia, unspecified severity, with other behavioral disturbance: Secondary | ICD-10-CM | POA: Diagnosis not present

## 2023-12-28 DIAGNOSIS — R451 Restlessness and agitation: Secondary | ICD-10-CM | POA: Diagnosis not present

## 2023-12-28 DIAGNOSIS — F03911 Unspecified dementia, unspecified severity, with agitation: Secondary | ICD-10-CM | POA: Diagnosis not present

## 2023-12-28 DIAGNOSIS — Z515 Encounter for palliative care: Secondary | ICD-10-CM | POA: Diagnosis not present

## 2023-12-28 NOTE — Progress Notes (Signed)
 [facility], Room ED 8 [facility] Liaison Note  Received request from [facility], Severa Daniels, LCSW , Transitions of Care Manager, for hospice services at home after discharge.  Spoke with patient's son and wife to initiate education related to hospice philosophy, services, and team approach to care.  Son and wife  verbalized understanding of information given.  Per discussion, the plan is for discharge home today by EMS.   DME needs discussed.  Patient has the following equipment in the home: Hospital bed, WC, [facility] and built in shower chair.    Patient/family requests the following equipment in the home: None.    The address has been verified and is correct in the chart.   Please send signed and completed DNR home with the patient/family.  Please provide prescriptions at discharge as needed to ensure ongoing symptom management.   [facility] information and contact numbers given to son. Above information shared with Severa Daniels, LCSW, Transitions of Care Manager.     Please call with any Hospice related questions or concerns.  Thank you for the opportunity to participate in this patient's care.  Helmut Lobe, [place] [facility] Liaison [phone]

## 2023-12-28 NOTE — Progress Notes (Signed)
 Palliative Care Progress Note, Assessment & Plan   Patient Name: Jeffrey Robles       Date: 12/28/2023 DOB: [birth year]  Age: 78 y.o. MRN#: [id number] Attending Physician: No att. providers found Primary Care Physician: Patient, No Pcp Per Admit Date: 12/25/2023  Subjective: Patient is lying in bed, sleeping, in no apparent distress.  Respirations are even and unlabored.  NT is at bedside.  No family or friends present during my visit.  HPI: 78 y.o. male  with past medical history of HTN, CVA, GERD, gastric cancer s/p gastrectomy and dementia. He presented to ED 12/25/23 via EMS c/o agitation. Family at bedside reports that patient has advanced dementia that is usually managed with medication. Today, patient became agitated and was physically aggressive. Due to safety concerns of patient, family members and small children in the home, they called EMS for help.    PMT consulted to discuss GOC in the setting of rapid decline due to dementia.  Summary of counseling/coordination of care: Extensive chart review completed prior to meeting patient including labs, vital signs, imaging, progress notes, orders, and available advanced directive documents from current and previous encounters.   After reviewing the patient's chart and assessing the patient at bedside, I am unable to complete a symptom assessment given patient's lethargy, sound sleep.  I spoke with NP at bedside as well as patient's dayshift RN.  Patient has been resting comfortably all morning with no signs of distress or agitation noted.  Discussed medication regimen with RN.  No adjustments to [facility] needed at this time.  As per discussion with RN and secure chat with attending and TOC, plan is for patient to discharge today with hospice services to follow at  home from [facility].  TOC following closely to coordinate discharge with transport and hospice liaison Marylou Sobers  No acute palliative needs identified at this time.  PMT remains available to patient and family throughout his hospitalization.  Please reengage with PMD if goals change, patient/family's request, or if patient's health deteriorates prior to discharge.  Physical Exam Vitals reviewed.  Constitutional:      General: He is not in acute distress.    Appearance: Normal appearance. He is normal weight.  HENT:     Head: Normocephalic.     Mouth/Throat:     Mouth: Mucous membranes are moist.  Cardiovascular:     Rate and Rhythm: Normal rate.  Pulmonary:     Effort: Pulmonary effort is normal.  Abdominal:     Palpations: Abdomen is soft.  Musculoskeletal:     Right lower leg: No edema.     Left lower leg: No edema.  Skin:    General: Skin is warm and dry.     Coloration: Skin is not jaundiced or pale.  Psychiatric:        Behavior: Behavior normal.             Total Time 25 minutes   Time spent includes: Detailed review of medical records (labs, imaging, vital signs), medically appropriate exam (mental status, respiratory, cardiac, skin), discussed with treatment team, counseling and educating patient, family and staff, documenting clinical information, medication management and coordination of care.  Judeen Nose L. Sheldon Amara, DNP, FNP-BC  Palliative Medicine Team

## 2023-12-28 NOTE — ED Provider Notes (Signed)
   TOC and palliative care team have arranged for patient to be discharged home on home hospice.  Transport has arrived.  Discharging patient home now.  Home hospice arranged.   Collis Deaner, MD 12/28/23 [phone]

## 2023-12-28 NOTE — ED Notes (Signed)
 Pt has been consistently trying to get out of bed and getting agitated with staff - throwing his hands and legs. Pt refusing to take pills. Pt uncooperative despite redirection and reorientation. Provider notified - new med orders.

## 2023-12-28 NOTE — ED Notes (Signed)
 Provider notified of current vitals 89/60 (71) - MAP >65, pt is asleep. Will continue to monitor at this time.

## 2023-12-28 NOTE — Discharge Instructions (Addendum)
 You are discharged home with plan for initiation of home hospice care.  Please follow-up with your hospice provider.  Return to the emergency department as needed.

## 2023-12-28 NOTE — ED Notes (Signed)
 [facility] at bedside.

## 2023-12-28 NOTE — TOC Progression Note (Signed)
 Transition of Care [facility]) - Progression Note    Patient Details  Name: Jeffrey Robles MRN: [id number] Date of Birth: Jun 12, 1946  Transition of [facility]) CM/SW Contact  Elmira Haddock, LCSW Phone Number: 12/28/2023, 11:02 AM  Clinical Narrative:   Spoke wit patient's son, Merrill Villarruel [phone]) about setting up transport to home.  Pt will be followed by [facility]. Pt transport scheduled for 4:30pm with [facility] (spoke with Blaise Bumps).      Expected Discharge Plan: Memory Care Barriers to Discharge: Continued Medical Work up  Expected Discharge Plan and Services       Living arrangements for the past 2 months: Single Family Home                                       Social Determinants of Health (SDOH) Interventions SDOH Screenings   Food Insecurity: No Food Insecurity (12/14/2023)   Received from [facility]  Housing: Low Risk  (10/01/2023)   Received from [facility]  Transportation Needs: No Transportation Needs (12/14/2023)   [facility]  Utilities: Not At Risk (10/01/2023)   Received from [facility]  Financial Resource Strain: Low Risk  (12/14/2023)   Received from [facility]  Physical Activity: Inactive (04/15/2023)   Received from [facility]  Social Connections: Moderately Integrated (04/15/2023)   Received from [facility]  Stress: Stress Concern Present (04/15/2023)   Received from [facility]  Tobacco Use: Low Risk  (12/25/2023)  Recent Concern: Tobacco Use - Medium Risk (12/05/2023)   Received from [facility]  Health Literacy: Adequate Health Literacy (04/15/2023)   Received from [facility]    Readmission Risk Interventions     No data to display

## 2023-12-28 NOTE — ED Provider Notes (Signed)
-----------------------------------------   4:37 AM on 12/28/2023 -----------------------------------------   Blood pressure 99/61, pulse 95, temperature (!) 97.5 F (36.4 C), temperature source Oral, resp. rate 18, SpO2 100%.  The patient is calm and cooperative at this time.  There have been no acute events since the last update.  Awaiting disposition plan from case management/social work.    Alee Katen, Clover Dao, DO 12/28/23 [phone]

## 2024-02-06 DEATH — deceased
